# Patient Record
Sex: Female | Born: 1950 | Race: Asian | Hispanic: No | Smoking: Never smoker
Health system: Southern US, Community
[De-identification: ages and names within clinical notes are randomized; demographics above are authoritative.]

## PROBLEM LIST (undated history)

## (undated) DIAGNOSIS — R0989 Other specified symptoms and signs involving the circulatory and respiratory systems: Secondary | ICD-10-CM

## (undated) DIAGNOSIS — I10 Essential (primary) hypertension: Secondary | ICD-10-CM

## (undated) DIAGNOSIS — G4730 Sleep apnea, unspecified: Secondary | ICD-10-CM

## (undated) DIAGNOSIS — J449 Chronic obstructive pulmonary disease, unspecified: Secondary | ICD-10-CM

## (undated) HISTORY — PX: HERNIA REPAIR: SHX51

---

## 2013-11-08 ENCOUNTER — Ambulatory Visit: Payer: Self-pay

## 2013-11-08 ENCOUNTER — Ambulatory Visit: Payer: Self-pay | Admitting: Internal Medicine

## 2013-11-08 VITALS — HR 101 | Temp 99.7°F | Resp 17 | Ht <= 58 in | Wt 128.0 lb

## 2013-11-08 DIAGNOSIS — J449 Chronic obstructive pulmonary disease, unspecified: Secondary | ICD-10-CM

## 2013-11-08 DIAGNOSIS — R0602 Shortness of breath: Secondary | ICD-10-CM

## 2013-11-08 DIAGNOSIS — R05 Cough: Secondary | ICD-10-CM

## 2013-11-08 DIAGNOSIS — J9801 Acute bronchospasm: Secondary | ICD-10-CM

## 2013-11-08 DIAGNOSIS — J45909 Unspecified asthma, uncomplicated: Secondary | ICD-10-CM | POA: Insufficient documentation

## 2013-11-08 DIAGNOSIS — J441 Chronic obstructive pulmonary disease with (acute) exacerbation: Secondary | ICD-10-CM

## 2013-11-08 DIAGNOSIS — R0902 Hypoxemia: Secondary | ICD-10-CM

## 2013-11-08 DIAGNOSIS — R509 Fever, unspecified: Secondary | ICD-10-CM

## 2013-11-08 DIAGNOSIS — E785 Hyperlipidemia, unspecified: Secondary | ICD-10-CM | POA: Insufficient documentation

## 2013-11-08 DIAGNOSIS — I2510 Atherosclerotic heart disease of native coronary artery without angina pectoris: Secondary | ICD-10-CM | POA: Insufficient documentation

## 2013-11-08 DIAGNOSIS — I10 Essential (primary) hypertension: Secondary | ICD-10-CM | POA: Insufficient documentation

## 2013-11-08 LAB — POCT CBC
HCT, POC: 43.7 % (ref 37.7–47.9)
Lymph, poc: 2 (ref 0.6–3.4)
MCH, POC: 30.1 pg (ref 27–31.2)
MCHC: 31.6 g/dL — AB (ref 31.8–35.4)
MCV: 95.5 fL (ref 80–97)
MID (cbc): 1 — AB (ref 0–0.9)
POC Granulocyte: 9.2 — AB (ref 2–6.9)
POC LYMPH PERCENT: 16.6 %L (ref 10–50)
Platelet Count, POC: 226 10*3/uL (ref 142–424)
RDW, POC: 13.2 %
WBC: 12.2 10*3/uL — AB (ref 4.6–10.2)

## 2013-11-08 IMAGING — CR DG CHEST 2V
2 series · 2 of 2 positions shown · non-contrast
Comparison: None available

CLINICAL DATA: Cough, congestion

EXAM:
CHEST  2 VIEW

[PA]
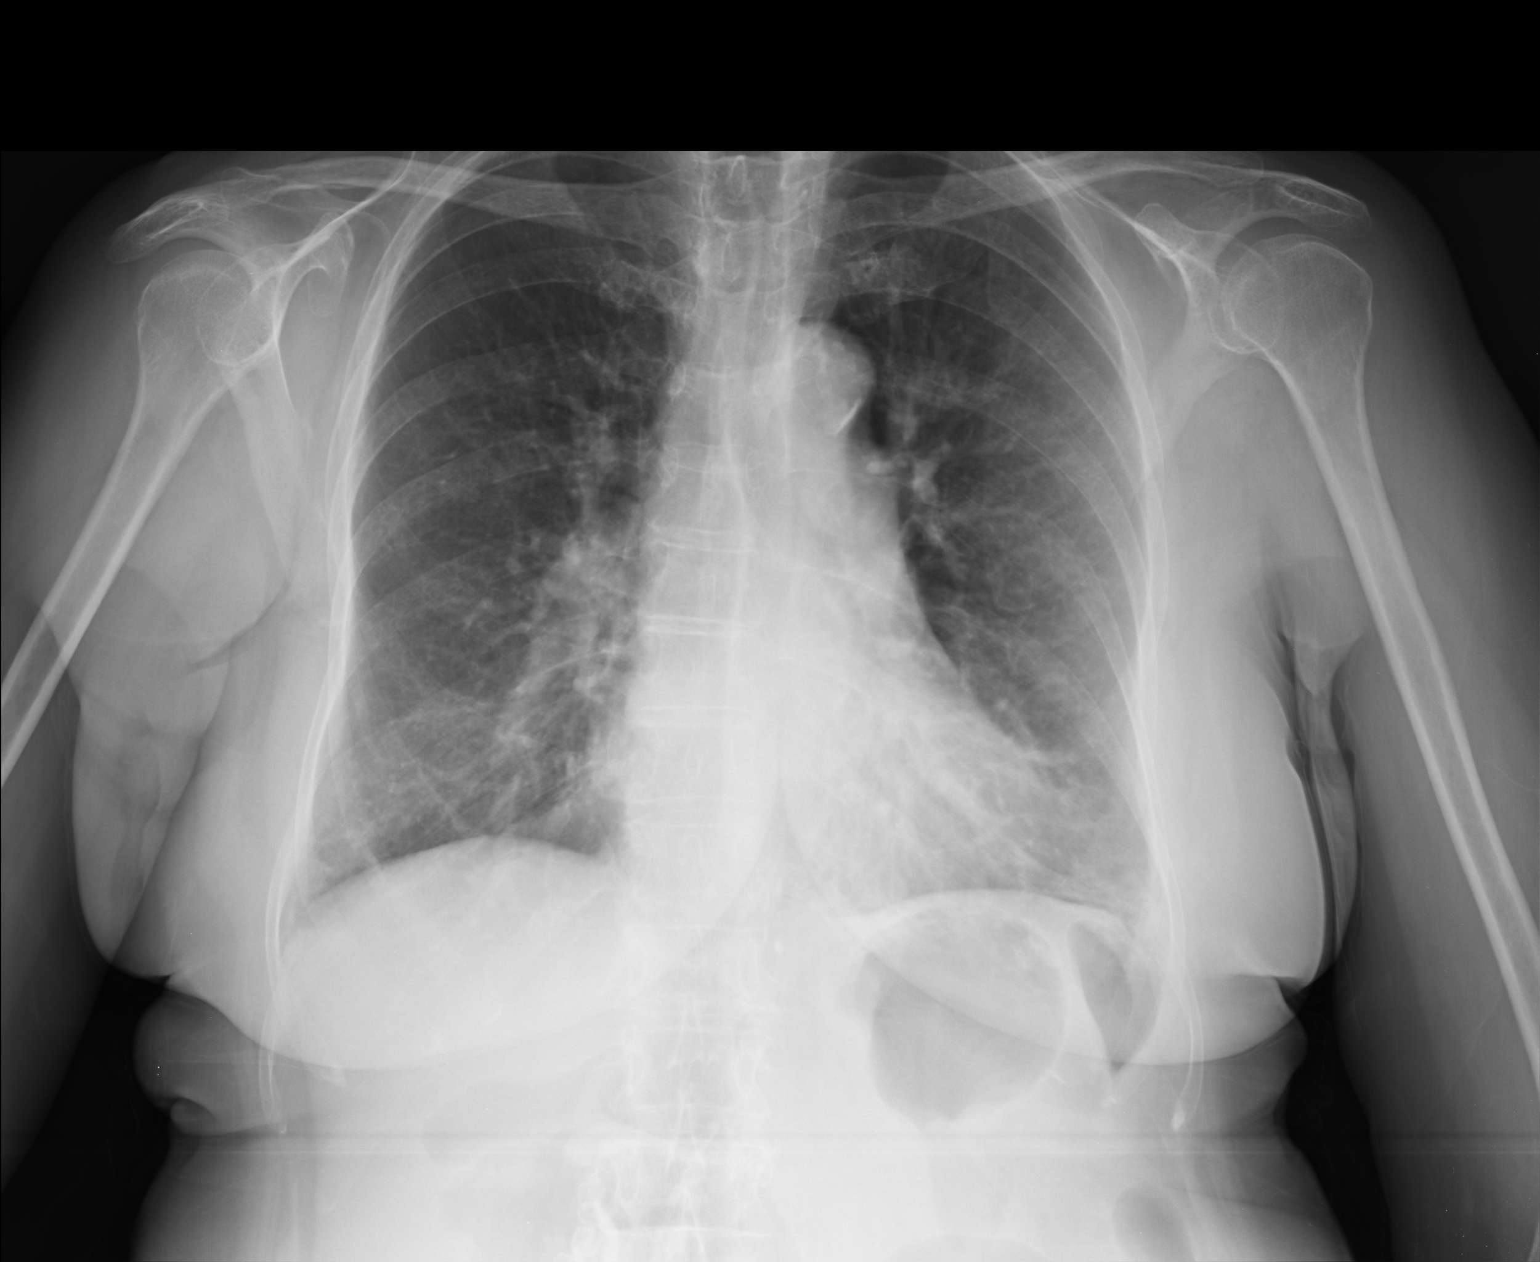

[lateral]
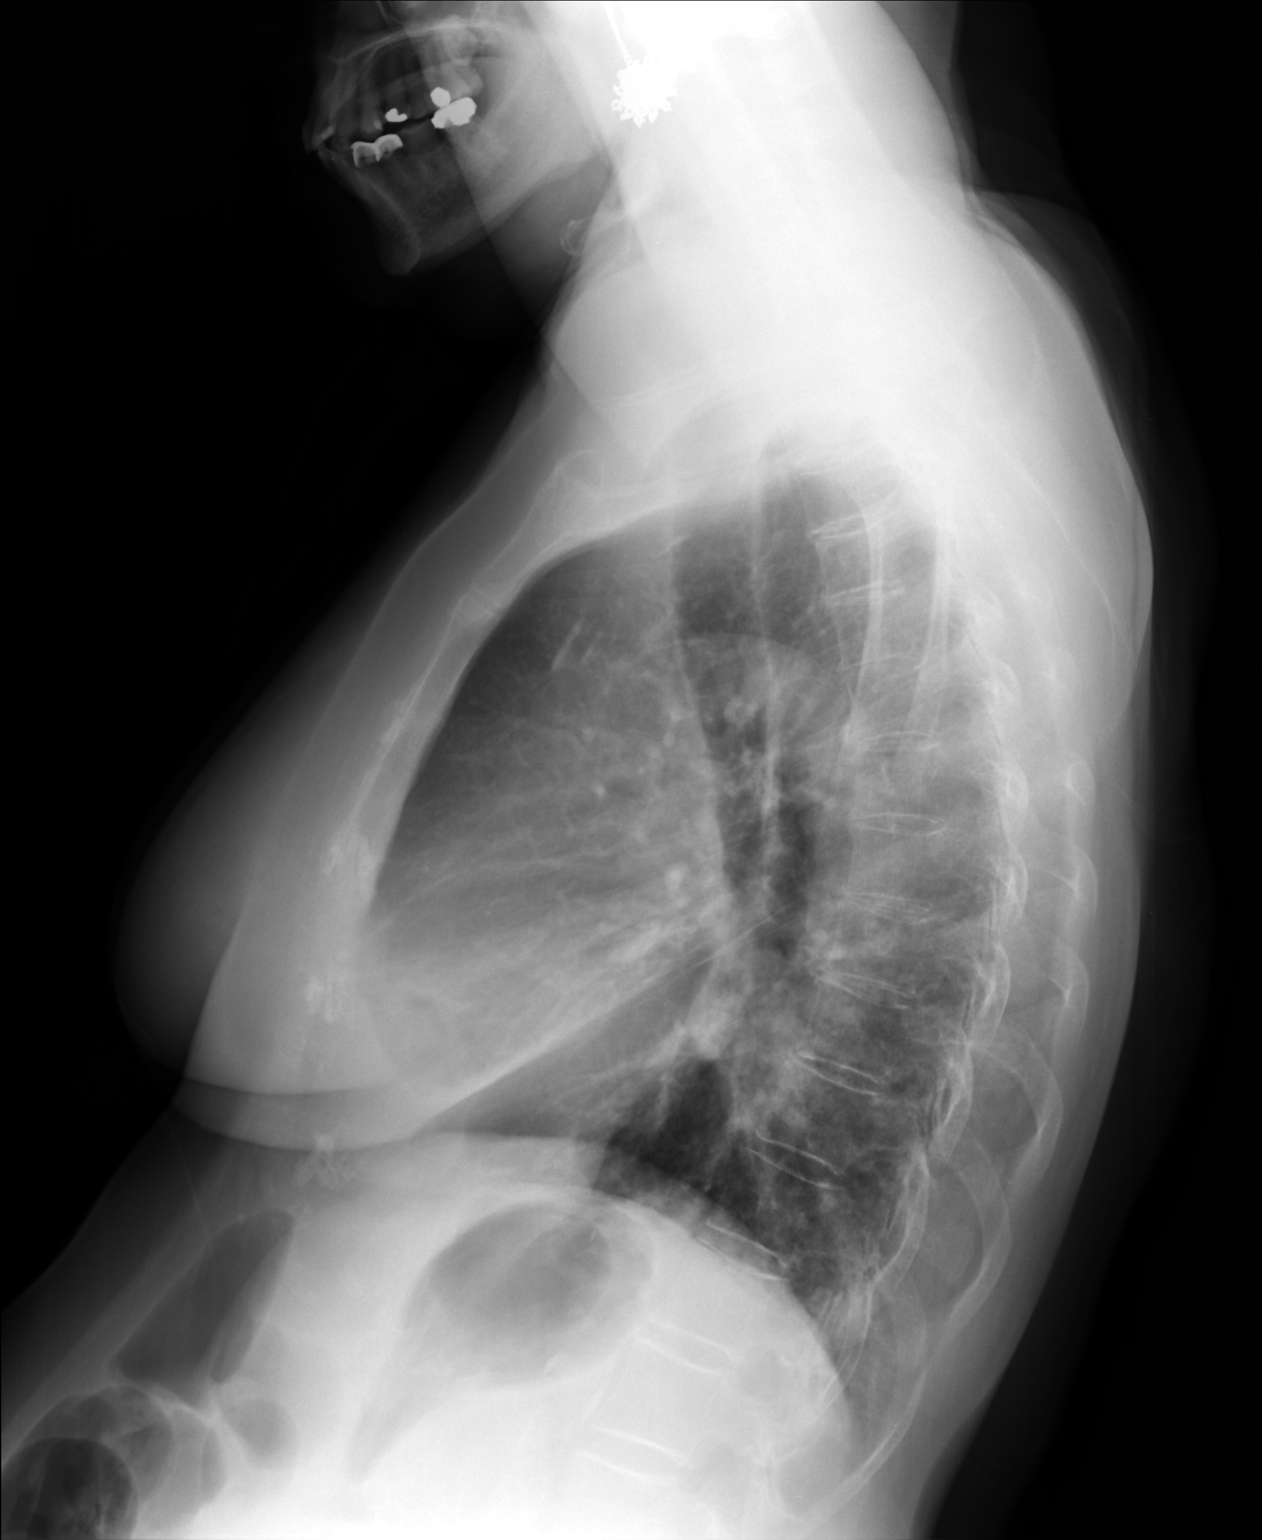

[2 of 2 positions shown; findings below may reference images not displayed]

FINDINGS: Normal cardiac silhouette. Minimal atherosclerotic plaque within the
thoracic aorta. There are minimal linear heterogeneous opacities
within the right middle lobe and lingula. There is mild diffuse
slightly nodular thickening of the pulmonary interstitium. No
pleural effusion pneumothorax. No evidence of edema. Unchanged
bones.
IMPRESSION: 1. Heterogeneous opacities within the right middle lobe and lingula
are favored to be the sequela of chronic CT infection and/or
atelectasis/scar, though in the absence of prior examinations, an
acute on chronic process is not excluded. Further evaluation could
be performed with chest CT as clinically indicated.
2. Mild bronchitic change.

## 2013-11-08 MED ORDER — LEVOFLOXACIN 500 MG PO TABS: 500.0000 mg | ORAL_TABLET | Freq: Every day | ORAL | Status: DC

## 2013-11-08 MED ORDER — HYDROCODONE-HOMATROPINE 5-1.5 MG/5ML PO SYRP: 5.0000 mL | ORAL_SOLUTION | Freq: Four times a day (QID) | ORAL | Status: DC | PRN

## 2013-11-08 MED ORDER — PREDNISONE 20 MG PO TABS: ORAL_TABLET | ORAL | Status: DC

## 2013-11-08 NOTE — Progress Notes (Signed)
Subjective:    Patient ID: Latoya Foster, female    DOB: 1951-08-02, 62 y.o.   MRN: [id number]  [id number] from [place] visiting her family on extended stay. Has underlying health problems: HTN- on amlod-5, dilti SR 90 bid, telma-H 40, lasix qod prn CAD-single vessel LAD less than critical, dx at unstable angina presentation in 2010-on plavix HL-atorva 40 Known pulm dz-COPD and Asthma on foracort,tiova(atrovent) and occas nebs-levolin  PO2 drops with walk into low 80s or even 70s  Hx of incr sob and cough with intermittent fever over past week despite nebs and adding amox given by her MD in [place] for travel/PND w/ need to sleep sitting up due to cough increase No ST,nasal cong No edema or chest pain  PMH-+pneumovax  No recent hospitalizations  ECHO 9/14 wnl  PO2 86 6/14  CT chest 10/13 atelectasis RML and lingula/bilat bronchiectasis lower lungfields  Review of Systems  Constitutional: Positive for fever, activity change, appetite change and fatigue. Negative for chills, diaphoresis and unexpected weight change.  Cardiovascular: Negative for chest pain, palpitations and leg swelling.  Gastrointestinal: Negative for nausea, vomiting and abdominal pain.       Was bloated recently but has resolved  Skin: Negative for rash.  Psychiatric/Behavioral:       No behav chges       Objective:   Physical Exam Pulse 101  Temp(Src) 99.7 F (37.6 C) (Oral)  Resp 17  Ht 4\' 10"  (1.473 m)  Wt 128 lb (58.06 kg)  BMI 26.76 kg/m2  SpO2 90%on 2L O2 Alert/oriented despite pulse ox 85-83 on arrival HEENT clear--No JVD Heart reg at 95 w/out m,r,g Lungs with wheezing bilat on both I&E with rhonchi mid lungfields to bases posteriorly on expiration abd soft No perip edema cn2-12 intact Symptoms improved at 2L O2    [facility] reading (PRIMARY) by  Dr. Merla Riches atelectasis RML and lingular present on film from [place] 2013,but appear a bit more prominent to me tonight.- Reading also suggested  bronchiectasis lower lobes bilat-but no obvious changes and diaphr borders clear   Results for orders placed in visit on 11/08/13  POCT CBC      Result Value Range   WBC 12.2 (*) 4.6 - 10.2 K/uL   Lymph, poc 2.0  0.6 - 3.4   POC LYMPH PERCENT 16.6  10 - 50 %L   MID (cbc) 1.0 (*) 0 - 0.9   POC MID % 8.0  0 - 12 %M   POC Granulocyte 9.2 (*) 2 - 6.9   Granulocyte percent 75.4  37 - 80 %G   RBC 4.58  4.04 - 5.48 M/uL   Hemoglobin 13.8  12.2 - 16.2 g/dL   HCT, POC 40.9  81.1 - 47.9 %   MCV 95.5  80 - 97 fL   MCH, POC 30.1  27 - 31.2 pg   MCHC 31.6 (*) 31.8 - 35.4 g/dL   RDW, POC 91.4     Platelet Count, POC 226  142 - 424 K/uL   MPV 7.8  0 - 99.8 fL   Duoneb improved subjective sxtoms--lung findings less prominent tho still marked expiratory changes PO2 93 after/then 91...  Assessment & Plan:  SOB  Acute bronchospasm - Plan: DG Chest 2 View, predniSONE (DELTASONE) 20 MG tablet  Hypoxia - Plan: DG Chest 2 View, Brain natriuretic peptide  Fever - Plan: POCT CBC, Comprehensive metabolic panel  COPD exacerbation - Plan: levofloxacin (LEVAQUIN) 500 MG tablet, predniSONE (DELTASONE) 20 MG tablet  HTN (hypertension)  Meds ordered this encounter  Medications  . levofloxacin (LEVAQUIN) 500 MG tablet    Sig: Take 1 tablet (500 mg total) by mouth daily.    Dispense:  7 tablet    Refill:  0  . predniSONE (DELTASONE) 20 MG tablet    Sig: 3/3/2/2/1/1 single daily dose for 6 days    Dispense:  12 tablet    Refill:  0  . HYDROcodone-homatropine (HYCODAN) 5-1.5 MG/5ML syrup    Sig: Take 5 mLs by mouth every 6 (six) hours as needed for cough.    Dispense:  120 mL    Refill:  0   CMET,BNP pending Consider home O2

## 2013-11-09 LAB — BRAIN NATRIURETIC PEPTIDE: Brain Natriuretic Peptide: 14.8 pg/mL (ref 0.0–100.0)

## 2013-11-09 LAB — COMPREHENSIVE METABOLIC PANEL WITH GFR
ALT: 14 U/L (ref 0–35)
AST: 18 U/L (ref 0–37)
Albumin: 3.9 g/dL (ref 3.5–5.2)
Alkaline Phosphatase: 82 U/L (ref 39–117)
BUN: 6 mg/dL (ref 6–23)
CO2: 31 meq/L (ref 19–32)
Calcium: 8.9 mg/dL (ref 8.4–10.5)
Chloride: 102 meq/L (ref 96–112)
Creat: 0.44 mg/dL — ABNORMAL LOW (ref 0.50–1.10)
Glucose, Bld: 144 mg/dL — ABNORMAL HIGH (ref 70–99)
Potassium: 4 meq/L (ref 3.5–5.3)
Sodium: 140 meq/L (ref 135–145)
Total Bilirubin: 0.8 mg/dL (ref 0.3–1.2)
Total Protein: 6.8 g/dL (ref 6.0–8.3)

## 2021-06-10 ENCOUNTER — Ambulatory Visit (INDEPENDENT_AMBULATORY_CARE_PROVIDER_SITE_OTHER): Payer: 59 | Admitting: Pulmonary Disease

## 2021-06-10 ENCOUNTER — Encounter: Payer: Self-pay | Admitting: Pulmonary Disease

## 2021-06-10 ENCOUNTER — Other Ambulatory Visit: Payer: Self-pay

## 2021-06-10 VITALS — BP 118/74 | HR 69 | Temp 97.4°F | Ht <= 58 in | Wt 117.7 lb

## 2021-06-10 DIAGNOSIS — J479 Bronchiectasis, uncomplicated: Secondary | ICD-10-CM

## 2021-06-10 DIAGNOSIS — G4733 Obstructive sleep apnea (adult) (pediatric): Secondary | ICD-10-CM | POA: Diagnosis not present

## 2021-06-10 DIAGNOSIS — J45909 Unspecified asthma, uncomplicated: Secondary | ICD-10-CM | POA: Diagnosis not present

## 2021-06-10 DIAGNOSIS — I2729 Other secondary pulmonary hypertension: Secondary | ICD-10-CM

## 2021-06-10 DIAGNOSIS — J449 Chronic obstructive pulmonary disease, unspecified: Secondary | ICD-10-CM | POA: Diagnosis not present

## 2021-06-10 NOTE — Assessment & Plan Note (Addendum)
She seemed to have severe OSA that was corrected by CPAP with desaturations that was out of proportion.  Epworth score is 11 & she has daytime sleepines  We will reassess with a home sleep test.  Her current compliance is not great and this assessment will allow [place] to reinforce compliance

## 2021-06-10 NOTE — Assessment & Plan Note (Signed)
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  If she has significant sputum production in the future we will consider obtaining AFB for MAC but currently this does not seem to be an issue.  Based on previous imaging appears to be nonprogressive. We discussed signs and symptoms of an exacerbation requiring oral antibiotics and airway clearance

## 2021-06-10 NOTE — Patient Instructions (Addendum)
  Continue your current medications -when you are close to running out, call me for refills and we can substitute with Advair/Spiriva or Trelegy Ambulatory saturation. Schedule PFTs. Schedule home sleep test -Based on this we will decide need for CPAP and/ or  oxygen during sleep We discussed signs and symptoms of a flareup and please call me as needed

## 2021-06-10 NOTE — Progress Notes (Addendum)
Subjective:    Patient ID: Latoya Foster, female    DOB: [birth year], 70 y.o.   MRN: [id number]  HPI  Chief Complaint  Patient presents with   Consult    Just moved from [place], needs to get established with pulmonary for COPD, Bronchiectasis, doing ok at the moment    70 year old never smoker presents to establish care for bronchiectasis, "COPD/asthma" chronic hypoxic respiratory failure Her daughter Deniece Ree is a pediatrician at [facility].  She has just emigrated from [place] and plans to reside here and is applying for green card. I have reviewed her extensive records from [place] before moving to [place] in the 70s and again back to [place] 2014.  There is no history of exposure to feels.  Lung problem started in 2014 when she was diagnosed with asthma and bronchiectasis She was hospitalized in 2018 and again in 2019 initially placed on nocturnal oxygen, then on CPAP for OSA. In 2020 she was hospitalized and found to be hypoxic to 80% on room air and started on daytime oxygen I note an echo from 2019 showing severe pulmonary hypertension but follow-up echo in 2022 seems to show decreased RVSP to 38 She moved to [place] in March 2020 and even though she brought her oxygen with her heart running more than 92% on room air and she has rarely needed oxygen  She was diagnosed with OSA & provided CPAP. ESS is 11 & she reports daytime naps & sleepiness   She is maintained on a regimen =  Symbicort, tiotropium and Singulair  PMH -hypertension, hyperlipidemia, angina, unclear why she is on Plavix  Past surgical history septoplasty 92, umbilical hernia repair 2008  Significant tests/ events reviewed  NPSG 2016-AHI 30/hour with desaturation 62% out of proportion to degree of OSA, corrected by CPAP 12 cm  CT angiogram chest 02/2018 bronchiectasis of right middle lobe and lingula, dilated pulmonary artery 31 mm  Echo 2022 RVSP 38 Echo 02/2018 RVSP 67 with severe TR   No  known drug allergies Social lifetime never smoker, emigrated to the [place] 01/2021, lives with her daughter in [place] Chest x-ray 10/2013 reviewed within our system, shows opacities within right middle lobe and lingula likely due to bronchiectasis  Family history of asthma and heart disease on her mother side  No past medical history on file.   Review of Systems Constitutional: negative for anorexia, fevers and sweats  Eyes: negative for irritation, redness and visual disturbance  Ears, nose, mouth, throat, and face: negative for earaches, epistaxis, nasal congestion and sore throat  Respiratory: negative for cough, sputum and wheezing  Cardiovascular: negative for chest pain, lower extremity edema, orthopnea, palpitations and syncope  Gastrointestinal: negative for abdominal pain, constipation, diarrhea, melena, nausea and vomiting  Genitourinary:negative for dysuria, frequency and hematuria  Hematologic/lymphatic: negative for bleeding, easy bruising and lymphadenopathy  Musculoskeletal:negative for arthralgias, muscle weakness and stiff joints  Neurological: negative for coordination problems, gait problems, headaches and weakness  Endocrine: negative for diabetic symptoms including polydipsia, polyuria and weight loss     Objective:   Physical Exam  Gen. Pleasant, well-nourished, in no distress, normal affect ENT - no pallor,icterus, no post nasal drip Neck: No JVD, no thyromegaly, no carotid bruits Lungs: no use of accessory muscles, no dullness to percussion, dry crackles both axilla Cardiovascular: Rhythm regular, heart sounds  normal, no murmurs or gallops, no peripheral edema Abdomen: soft and non-tender, no hepatosplenomegaly, BS normal. Musculoskeletal: No deformities, no cyanosis or clubbing  Neuro:  alert, non focal       Assessment & Plan:

## 2021-06-10 NOTE — Assessment & Plan Note (Signed)
Continue her current regimen Equell him to Symbicort and Spiriva.  When she runs out of her medications that she has brought with her from [place], we can prescribe above or consider single triple therapy regimen such as Trelegy or [employer]

## 2021-06-10 NOTE — Assessment & Plan Note (Signed)
Appears to be secondary to cardiopulmonary cause.  On the more recent echo in 20 Rendu RVSP seems to have decreased to 38.  This might explain improvement in her hypoxia.

## 2021-06-20 ENCOUNTER — Other Ambulatory Visit: Payer: Self-pay | Admitting: Internal Medicine

## 2021-06-20 DIAGNOSIS — E2839 Other primary ovarian failure: Secondary | ICD-10-CM

## 2021-06-20 DIAGNOSIS — R5381 Other malaise: Secondary | ICD-10-CM

## 2021-06-25 ENCOUNTER — Other Ambulatory Visit: Payer: Self-pay

## 2021-06-25 ENCOUNTER — Ambulatory Visit
Admission: RE | Admit: 2021-06-25 | Discharge: 2021-06-25 | Disposition: A | Discharge status: Home / Self Care | Payer: 59 | Source: Ambulatory Visit | Attending: Internal Medicine | Admitting: Internal Medicine

## 2021-06-25 DIAGNOSIS — E2839 Other primary ovarian failure: Secondary | ICD-10-CM

## 2021-07-22 ENCOUNTER — Other Ambulatory Visit: Payer: Self-pay

## 2021-07-22 ENCOUNTER — Ambulatory Visit: Payer: 59

## 2021-07-22 DIAGNOSIS — J449 Chronic obstructive pulmonary disease, unspecified: Secondary | ICD-10-CM

## 2021-07-22 DIAGNOSIS — G4733 Obstructive sleep apnea (adult) (pediatric): Secondary | ICD-10-CM

## 2021-07-24 ENCOUNTER — Telehealth: Payer: Self-pay | Admitting: Pulmonary Disease

## 2021-07-24 DIAGNOSIS — G4733 Obstructive sleep apnea (adult) (pediatric): Secondary | ICD-10-CM

## 2021-07-24 DIAGNOSIS — J479 Bronchiectasis, uncomplicated: Secondary | ICD-10-CM

## 2021-07-24 NOTE — Telephone Encounter (Signed)
Home sleep test showed moderate OSA AHI 24/hour Oxygen dropped a good bit during her sleep lowest saturation was 62%  Ideally she needs a CPAP titration study If she wants to hold off for cost purposes that would be okay but would suggest that she get back on her CPAP machine and have oxygen blended into the machine

## 2021-07-24 NOTE — Telephone Encounter (Signed)
Tried calling the pt and there was no answer and no option to leave msg.

## 2021-08-07 NOTE — Telephone Encounter (Signed)
I spoke with the pt's daughter ok per DPR and notified of results  She verbalized understanding  Order was placed for CPAP titration study  Daughter was asking if we should wait until after test to start her o2, she has an old concentrator and it's not working well

## 2021-08-08 NOTE — Telephone Encounter (Signed)
Latoya Milch, MD to Me     8:37 PM She should restart CPAP and oxygen while awaiting titration study and then I can clarify whether she needs both her only CPAP would suffice   Please advise settings for CPAP and for o2, thanks

## 2021-08-11 NOTE — Telephone Encounter (Signed)
Caller: Unspecified (2 weeks ago) She has both CPAP & O2, from [place]  So resume at same settings - I do not have those settings on record   Spoke with the pt's daughter and notified her of response per Dr Vassie Loll. She will have her resume what she was on before until we get the CPAP titration done. Nothing further needed.

## 2021-08-12 ENCOUNTER — Other Ambulatory Visit: Payer: Self-pay | Admitting: Internal Medicine

## 2021-08-12 DIAGNOSIS — Z1231 Encounter for screening mammogram for malignant neoplasm of breast: Secondary | ICD-10-CM

## 2021-08-26 ENCOUNTER — Encounter: Payer: Self-pay | Admitting: *Deleted

## 2021-08-26 ENCOUNTER — Telehealth: Payer: Self-pay | Admitting: Pulmonary Disease

## 2021-08-26 DIAGNOSIS — G4734 Idiopathic sleep related nonobstructive alveolar hypoventilation: Secondary | ICD-10-CM

## 2021-08-26 NOTE — Telephone Encounter (Signed)
Latoya Milch, MD to Me     12:37 PM  Ok to send order   I placed order and sent a msg to pt via mychart regarding this per daughter's request.

## 2021-08-26 NOTE — Telephone Encounter (Signed)
Spoke with the pt's daughter  She states that pt's concentrator from [place] is not working properly anymore  It's making a beeping noise all the time She uses 2lpm at night with her CPAP  Not est with DME here  Please advise if okay to send order

## 2021-09-16 ENCOUNTER — Encounter: Payer: Self-pay | Admitting: Pulmonary Disease

## 2021-09-16 ENCOUNTER — Other Ambulatory Visit: Payer: Self-pay

## 2021-09-16 ENCOUNTER — Ambulatory Visit (INDEPENDENT_AMBULATORY_CARE_PROVIDER_SITE_OTHER): Payer: 59 | Admitting: Pulmonary Disease

## 2021-09-16 DIAGNOSIS — G4733 Obstructive sleep apnea (adult) (pediatric): Secondary | ICD-10-CM

## 2021-09-16 DIAGNOSIS — J449 Chronic obstructive pulmonary disease, unspecified: Secondary | ICD-10-CM

## 2021-09-16 DIAGNOSIS — J479 Bronchiectasis, uncomplicated: Secondary | ICD-10-CM | POA: Diagnosis not present

## 2021-09-16 LAB — PULMONARY FUNCTION TEST
DL/VA % pred: 93 %
DL/VA: 3.73 ml/min/mmHg/L
DLCO cor % pred: 46 %
DLCO cor: 7.84 ml/min/mmHg
DLCO unc % pred: 46 %
DLCO unc: 7.84 ml/min/mmHg
FEF 25-75 Post: 0.28 L/sec
FEF 25-75 Pre: 0.33 L/sec
FEF2575-%Change-Post: -13 %
FEF2575-%Pred-Post: 18 %
FEF2575-%Pred-Pre: 21 %
FEV1-%Change-Post: -5 %
FEV1-%Pred-Post: 32 %
FEV1-%Pred-Pre: 34 %
FEV1-Post: 0.54 L
FEV1-Pre: 0.57 L
FEV1FVC-%Change-Post: 5 %
FEV1FVC-%Pred-Pre: 85 %
FEV6-%Change-Post: -10 %
FEV6-Post: 0.75 L
FEV6-Pre: 0.84 L
FVC-%Change-Post: -10 %
FVC-%Pred-Post: 36 %
FVC-%Pred-Pre: 40 %
FVC-Post: 0.75 L
FVC-Pre: 0.84 L
Post FEV1/FVC ratio: 72 %
Post FEV6/FVC ratio: 100 %
Pre FEV1/FVC ratio: 68 %
Pre FEV6/FVC Ratio: 100 %
RV % pred: 109 %
RV: 2.14 L
TLC % pred: 74 %
TLC: 3.15 L

## 2021-09-16 NOTE — Assessment & Plan Note (Signed)
We discussed signs and symptoms of exacerbation for which she will call us as needed

## 2021-09-16 NOTE — Progress Notes (Signed)
   Subjective:    Patient ID: Latoya Foster, female    DOB: [birth year], 70 y.o.   MRN: [id number]  HPI  70 year old never smoker for FU of bronchiectasis, "COPD/asthma" chronic hypoxic respiratory failure , OSA on CPAP Her daughter Latoya Foster is a pediatrician at [facility] health  PMH -hypertension, hyperlipidemia, angina, unclear why she is on Plavix  In 2020 she was hospitalized in [place] and found to be hypoxic to 80% on room air and started on daytime oxygen   After her last visit, we repeated home sleep test which showed moderate OSA and severe desaturation to 62%.  I asked her to continue on CPAP with oxygen blended in.  She is obtain an oxygen concentrator to use during sleep. She is planning to travel to [place] by December to be with her son for his 50th birthday and plans to stay there for 3 months.  Daughter questions whether she needs oxygen during travel. On ambulation she desaturated to 92% after 2 laps.  And recovered quickly. No interim cough or wheezing. She is compliant with medications that she had prescribed from [place] which is the equivalent of Symbicort and Spiriva  Significant tests/ events reviewed  PFTs 08/2021 >> severe airway obstruction, ratio 68, FEV1 34%, FVC 40%, no bronchodilator response, TLC 74%, DLCO 7.8/26%   HST 06/2021 - moderate OSA AHI 24/hour Nadir lowest saturation was 62%  NPSG 2016-AHI 30/hour with desaturation 62% out of proportion to degree of OSA, corrected by CPAP 12 cm   CT angiogram chest 02/2018 bronchiectasis of right middle lobe and lingula, dilated pulmonary artery 31 mm   Echo 2022 RVSP 38 Echo 02/2018 RVSP 67 with severe TR    Review of Systems neg for any significant sore throat, dysphagia, itching, sneezing, nasal congestion or excess/ purulent secretions, fever, chills, sweats, unintended wt loss, pleuritic or exertional cp, hempoptysis, orthopnea pnd or change in chronic leg swelling. Also denies presyncope, palpitations, heartburn,  abdominal pain, nausea, vomiting, diarrhea or change in bowel or urinary habits, dysuria,hematuria, rash, arthralgias, visual complaints, headache, numbness weakness or ataxia.     Objective:   Physical Exam  Gen. Pleasant, elderly, thin  in no distress ENT - no thrush, no pallor/icterus,no post nasal drip Neck: No JVD, no thyromegaly, no carotid bruits Lungs: no use of accessory muscles, no dullness to percussion, right basal rales no rhonchi  Cardiovascular: Rhythm regular, heart sounds  normal, no murmurs or gallops, no peripheral edema Musculoskeletal: No deformities, no cyanosis or clubbing        Assessment & Plan:

## 2021-09-16 NOTE — Patient Instructions (Signed)
Ambulatory sat for POC Lung testing shows severe airway obstruction - lung capacity at 35%

## 2021-09-16 NOTE — Progress Notes (Signed)
Full PFT performed today. °

## 2021-09-16 NOTE — Assessment & Plan Note (Signed)
She has a formal CPAP titration study pending and based on this we will make changes to her CPAP and also decide whether she needs oxygen during sleep. In the meantime due to severe desaturation I will asked her to continue using oxygen blended into CPAP

## 2021-09-16 NOTE — Assessment & Plan Note (Addendum)
PFTs confirmed moderate to severe airway obstruction and perhaps some element of restriction with decreased DLCO and TLC.  She will continue medications which are the equivalent of Symbicort and Spiriva. We can switch her to triple therapy in the future.  She has enough medications to last her for the next 6 months Based on ambulatory saturations today she does not seem to qualify for oxygen. We discussed pulmonary rehab program

## 2021-09-16 NOTE — Patient Instructions (Signed)
Full PFT performed today. °

## 2021-09-29 ENCOUNTER — Ambulatory Visit (HOSPITAL_BASED_OUTPATIENT_CLINIC_OR_DEPARTMENT_OTHER): Payer: 59 | Attending: Pulmonary Disease | Admitting: Pulmonary Disease

## 2021-09-29 ENCOUNTER — Other Ambulatory Visit: Payer: Self-pay

## 2021-09-29 DIAGNOSIS — G4733 Obstructive sleep apnea (adult) (pediatric): Secondary | ICD-10-CM | POA: Insufficient documentation

## 2021-09-30 DIAGNOSIS — G4734 Idiopathic sleep related nonobstructive alveolar hypoventilation: Secondary | ICD-10-CM

## 2021-09-30 DIAGNOSIS — G4733 Obstructive sleep apnea (adult) (pediatric): Secondary | ICD-10-CM

## 2021-09-30 NOTE — Telephone Encounter (Signed)
Received the following message from patient:   "Hello Dr Vassie Loll,  At the last visit I had mentioned that my mouth was very dry after the start of night oxygen use. The company needs an order for the humidifier attachment. It is [employer]. Could you please send an order for the humidifier for the oxygen concentrator. I also had the in-hospital sleep study last night & will wait on the results & keep all the settings the same till then. Thank you and have a wonderful day. Latoya Foster"  Dr. Vassie Loll, please advise if you are ok with [place] placing an order for the humidifier. Thanks!

## 2021-10-01 NOTE — Telephone Encounter (Signed)
RA - please advise. Thanks! 

## 2021-10-02 ENCOUNTER — Telehealth: Payer: Self-pay | Admitting: Pulmonary Disease

## 2021-10-02 DIAGNOSIS — G4733 Obstructive sleep apnea (adult) (pediatric): Secondary | ICD-10-CM

## 2021-10-02 NOTE — Telephone Encounter (Signed)
CPAP titration study showed pressure requirements up to 17 cm. She already has a CPAP machine. Please have DME adjust settings to auto 5 to 18 cm and hopefully we can check a download on her next visit. No oxygen was required during the study

## 2021-10-02 NOTE — Procedures (Signed)
Patient Name: Latoya Foster, Latoya Foster Date: 09/29/2021 Gender: Female D.O.B: 08-Dec-1950 Age (years): 47 Referring Provider: Cyril Mourning MD, ABSM Height (inches): 59 Interpreting Physician: Cyril Mourning MD, ABSM Weight (lbs): 114 RPSGT: Rosette Reveal BMI: 23 MRN: [id number] Neck Size: 14.00 <br> <br> CLINICAL INFORMATION The patient is referred for a CPAP titration to treat sleep apnea.    HST 06/2021 - moderate OSA AHI 24/hour, Nadir lowest saturation was 62% SLEEP STUDY TECHNIQUE As per the AASM Manual for the Scoring of Sleep and Associated Events v2.3 (April 2016) with a hypopnea requiring 4% desaturations.  The channels recorded and monitored were frontal, central and occipital EEG, electrooculogram (EOG), submentalis EMG (chin), nasal and oral airflow, thoracic and abdominal wall motion, anterior tibialis EMG, snore microphone, electrocardiogram, and pulse oximetry. Continuous positive airway pressure (CPAP) was initiated at the beginning of the study and titrated to treat sleep-disordered breathing.  MEDICATIONS Medications self-administered by patient taken the night of the study : N/A  RESPIRATORY PARAMETERS Optimal PAP Pressure (cm): 17 AHI at Optimal Pressure (/hr): 11.3 Overall Minimal O2 (%): 82.0 Supine % at Optimal Pressure (%): 0 Minimal O2 at Optimal Pressure (%): 84.0   SLEEP ARCHITECTURE The study was initiated at 10:54:58 PM and ended at 5:13:23 AM.  Sleep onset time was 18.3 minutes and the sleep efficiency was 72.5%%. The total sleep time was 274.5 minutes.  The patient spent 8.9%% of the night in stage N1 sleep, 66.5%% in stage N2 sleep, 0.0%% in stage N3 and 24.6% in REM.Stage REM latency was 167.5 minutes  Wake after sleep onset was 85.6. Alpha intrusion was absent. Supine sleep was 31.69%.  CARDIAC DATA The 2 lead EKG demonstrated sinus rhythm. The mean heart rate was 49.0 beats per minute. Other EKG findings include: None.  LEG MOVEMENT DATA The total  Periodic Limb Movements of Sleep (PLMS) were 0. The PLMS index was 0.0. A PLMS index of <15 is considered normal in adults.  IMPRESSIONS - The final PAP pressure was 17 cm of water. Predominantly REM related events were noted - Moderate oxygen desaturations were observed during this titration (min O2 = 82.0%). - The patient snored with soft snoring volume during this titration study. - No cardiac abnormalities were observed during this study. - Clinically significant periodic limb movements were not noted during this study. Arousals associated with PLMs were rare.  DIAGNOSIS - Obstructive Sleep Apnea (G47.33)  RECOMMENDATIONS - Trial of CPAP therapy on 17 cm H2O with a Small size Fisher&Paykel Full Face Mask F&P Vitera (new) mask and heated humidification. Alternatively, autoCPAP can be used. - Avoid alcohol, sedatives and other CNS depressants that may worsen sleep apnea and disrupt normal sleep architecture. - Sleep hygiene should be reviewed to assess factors that may improve sleep quality. - Weight management and regular exercise should be initiated or continued. - Return to [facility] Center for re-evaluation after 4 weeks of therapy   Cyril Mourning MD Board Certified in Sleep medicine

## 2021-10-08 DIAGNOSIS — G4733 Obstructive sleep apnea (adult) (pediatric): Secondary | ICD-10-CM

## 2021-10-08 NOTE — Telephone Encounter (Signed)
Please advise if patient needs to continue oxygen at night.   RA

## 2021-10-08 NOTE — Telephone Encounter (Signed)
Responded to pt's email with this info and the dme order was sent to change settings

## 2022-05-08 ENCOUNTER — Encounter: Payer: Self-pay | Admitting: Pulmonary Disease

## 2022-05-08 ENCOUNTER — Ambulatory Visit (INDEPENDENT_AMBULATORY_CARE_PROVIDER_SITE_OTHER): Payer: Managed Care, Other (non HMO) | Admitting: Pulmonary Disease

## 2022-05-08 VITALS — BP 115/52 | HR 84 | Temp 97.7°F | Ht <= 58 in | Wt 109.4 lb

## 2022-05-08 DIAGNOSIS — G4733 Obstructive sleep apnea (adult) (pediatric): Secondary | ICD-10-CM

## 2022-05-08 DIAGNOSIS — J479 Bronchiectasis, uncomplicated: Secondary | ICD-10-CM | POA: Diagnosis not present

## 2022-05-08 DIAGNOSIS — Z9989 Dependence on other enabling machines and devices: Secondary | ICD-10-CM

## 2022-05-08 DIAGNOSIS — J449 Chronic obstructive pulmonary disease, unspecified: Secondary | ICD-10-CM | POA: Diagnosis not present

## 2022-05-08 MED ORDER — TIOTROPIUM BROMIDE MONOHYDRATE 18 MCG IN CAPS: 18.0000 ug | ORAL_CAPSULE | Freq: Every day | RESPIRATORY_TRACT | 3 refills | Status: DC

## 2022-05-08 MED ORDER — BUDESONIDE-FORMOTEROL FUMARATE 160-4.5 MCG/ACT IN AERO: 2.0000 | INHALATION_SPRAY | Freq: Two times a day (BID) | RESPIRATORY_TRACT | 6 refills | Status: DC

## 2022-05-08 NOTE — Assessment & Plan Note (Signed)
Continue nocturnal CPAP, auto settings. We discussed alternative therapies including hypoglossal nerve stimulator implant and dental appliance. We will recheck nocturnal oximetry on CPAP/room air to see if she still needs nocturnal oxygen

## 2022-05-08 NOTE — Assessment & Plan Note (Signed)
Continue Symbicort and Spiriva Never smoker , this is likely on the basis of bronchiectasis or "fixed" airway obstruction

## 2022-05-08 NOTE — Progress Notes (Signed)
   Subjective:    Patient ID: Latoya Foster, female    DOB: Apr 11, 1951, 71 y.o.   MRN: [id number]  HPI  71 yo never smoker for FU of bronchiectasis, "COPD/asthma" chronic hypoxic respiratory failure , OSA on CPAP Her daughter Deniece Ree is a pediatrician at [facility] health   PMH -hypertension, hyperlipidemia, angina, on Plavix   In 2020 she was hospitalized in [place] and found to be hypoxic to 80% on room air and started on daytime oxygen  She has since weaned off daytime oxygen but uses it at night  Chief Complaint  Patient presents with   Follow-up    No complaints. No coughing, wheezing, SOB.  CPAP with no O2   She was in [place] for 5 months with her son and came back to [place] few weeks ago.  Uneventful duration for her,  No flareup  She reports compliance with CPAP although she has some trouble tolerating this.  She denies cough or wheezing.  She gets short of breath climbing stairs. Daughter accompanies and corroborates history. She wonders if she still needs oxygen She is compliant with Symbicort and Spiriva  Significant tests/ events reviewed  PFTs 08/2021 >> severe airway obstruction, ratio 68, FEV1 34%, FVC 40%, no bronchodilator response, TLC 74%, DLCO 7.8/26%   09/2021 CPAP titration study showed pressure requirements up to 17 cm, no O2 required   HST 06/2021 - moderate OSA AHI 24/hour Nadir lowest saturation was 62%   NPSG 2016-AHI 30/hour with desaturation 62% out of proportion to degree of OSA, corrected by CPAP 12 cm   CT angiogram chest 02/2018 bronchiectasis of right middle lobe and lingula, dilated pulmonary artery 31 mm   Echo 2022 RVSP 38 Echo 02/2018 RVSP 67 with severe TR   Review of Systems neg for any significant sore throat, dysphagia, itching, sneezing, nasal congestion or excess/ purulent secretions, fever, chills, sweats, unintended wt loss, pleuritic or exertional cp, hempoptysis, orthopnea pnd or change in chronic leg swelling. Also denies  presyncope, palpitations, heartburn, abdominal pain, nausea, vomiting, diarrhea or change in bowel or urinary habits, dysuria,hematuria, rash, arthralgias, visual complaints, headache, numbness weakness or ataxia.     Objective:   Physical Exam   Gen. Pleasant, elderly thin woman, in no distress ENT - no thrush, no pallor/icterus,no post nasal drip Neck: No JVD, no thyromegaly, no carotid bruits Lungs: no use of accessory muscles, no dullness to percussion, right basal dry rales no rhonchi  Cardiovascular: Rhythm regular, heart sounds  normal, no murmurs or gallops, no peripheral edema Musculoskeletal: No deformities, no cyanosis or clubbing         Assessment & Plan:

## 2022-05-08 NOTE — Patient Instructions (Signed)
  X Refills on symbicort & spiriva  X Check ONO on CPAP/ RA

## 2022-05-08 NOTE — Assessment & Plan Note (Addendum)
We discussed signs and symptoms of flareup for which she would need antibiotics.  She has done well over the past year We will repeat CT chest at some point in the future

## 2022-05-12 ENCOUNTER — Other Ambulatory Visit (HOSPITAL_COMMUNITY): Payer: Self-pay

## 2022-05-12 ENCOUNTER — Telehealth: Payer: Self-pay

## 2022-05-12 NOTE — Telephone Encounter (Signed)
Patient Advocate Encounter   Received notification that prior authorization for Spiriva HandiHaler capsules is required.   PA submitted on 05/12/2022 Key [id number] Status is pending

## 2022-05-12 NOTE — Telephone Encounter (Signed)
Patient Advocate Encounter   Received notification that prior authorization for Budesonide-Formoterol Fumarate 160-4.5MCG/ACT aerosol is required.   PA submitted on 05/12/2022 Key [id number] Status is pending

## 2022-05-20 ENCOUNTER — Other Ambulatory Visit (HOSPITAL_COMMUNITY): Payer: Self-pay

## 2022-05-20 NOTE — Telephone Encounter (Signed)
Dr. Vassie Loll please advise on message from pharmacy team.  Duwayne Heck are you able to do a test claim and see what is covered for patient?

## 2022-05-20 NOTE — Telephone Encounter (Signed)
ATC patient/daughter about inhaler change, LMTCB

## 2022-05-20 NOTE — Telephone Encounter (Signed)
[facility] Patient Advocate Encounter  Received notification that the request for prior authorization for Spiriva HandiHaler capsules  has been denied due to patient not having tried and failed Incruse Ellipta or Fernande Boyden (may require a prior auth).

## 2022-05-21 ENCOUNTER — Other Ambulatory Visit (HOSPITAL_COMMUNITY): Payer: Self-pay

## 2022-05-21 MED ORDER — INCRUSE ELLIPTA 62.5 MCG/ACT IN AEPB: 1.0000 | INHALATION_SPRAY | Freq: Every day | RESPIRATORY_TRACT | 5 refills | Status: DC

## 2022-05-21 NOTE — Addendum Note (Signed)
Addended by: Maurene Capes on: 05/21/2022 11:26 AM   Modules accepted: Orders

## 2022-05-21 NOTE — Telephone Encounter (Signed)
[facility] Patient Advocate Encounter  Received notification that the request for prior authorization for Budesonide-Formoterol Fumarate 160-4.5MCG/ACT aerosol  has been denied due to patient not having tried and failed any other step therapy. Insurance will cover Wixela inhaler at [employer]. Incruse was sent into patient's pharmacy today for a $33.25 copay.

## 2022-05-21 NOTE — Telephone Encounter (Signed)
Called and spoke with patient's daughter. She verbalized understanding of Incruse. She stated that she too had called the insurance and she was told that the Incruse is covered at 100%. She is interested in this RX. RX has been sent to the pharmacy.   While on the phone, she also mentioned that a RX for Symbicort had been sent to the pharmacy but it too needed a PA. Insurance informed her that they would cover Wixela at 100%.   She wanted to know if she still needs the Symbicort or related inhaler with the Incruse.   Dr. Vassie Loll, can you please advise? Thanks!

## 2022-05-22 MED ORDER — FLUTICASONE-SALMETEROL 250-50 MCG/ACT IN AEPB: 1.0000 | INHALATION_SPRAY | Freq: Two times a day (BID) | RESPIRATORY_TRACT | 5 refills | Status: DC

## 2022-05-22 NOTE — Telephone Encounter (Signed)
Dr. Vassie Loll, can you advise on the [facility] strength?

## 2022-05-22 NOTE — Addendum Note (Signed)
Addended by: Maurene Capes on: 05/22/2022 03:19 PM   Modules accepted: Orders

## 2022-05-22 NOTE — Telephone Encounter (Signed)
Called and spoke with patient's daughter. She verbalized understanding of instructions for Wixela. RX has been sent to the pharmacy.   Nothing further needed at time of call.

## 2022-06-06 ENCOUNTER — Telehealth: Payer: Self-pay | Admitting: Pulmonary Disease

## 2022-06-06 NOTE — Telephone Encounter (Signed)
ONO on CPAP/room air showed significant desaturation for almost 6 hours. She needs to continue oxygen blended into CPAP

## 2022-06-08 NOTE — Telephone Encounter (Signed)
Called and spoke with pt's daughter letting her know the results of pt's ONO and she verbalized understanding. Nothing further needed.

## 2022-07-14 ENCOUNTER — Telehealth: Payer: Self-pay | Admitting: Pulmonary Disease

## 2022-07-14 NOTE — Telephone Encounter (Signed)
Spk to daughter with provider recommendations, states pt doesn't currently see cardio and she will call PCP to get a referral   Nothing further

## 2022-07-14 NOTE — Telephone Encounter (Signed)
Spk with pt's daughter advised her PCP will need to prescribe rx. States this was a med she got in [place] and PCP will not prescribe.   Wanted to know if Latoya Foster will sent script for ivabradine (CORLANOR) 5 MG TABS tablet

## 2022-07-28 ENCOUNTER — Ambulatory Visit: Payer: Commercial Managed Care - HMO | Admitting: Cardiology

## 2022-08-06 NOTE — Progress Notes (Unsigned)
Patient referred by Leeroy Cha,* for coronary artery disease  Subjective:   Latoya Foster, female    DOB: Jun 26, 1951, 71 y.o.   MRN: [id number]   Chief Complaint  Patient presents with   Coronary Artery Disease   New Patient (Initial Visit)    HPI  71 y.o. [name] female with hypertension, coronary artery disease, asthma/bronchiectasis, here to establish care for management of CAD.  Patient recently moved to [place], lives with [name] who is a Lexicographer at [employer].  I reviewed prior hospitalization records from [place], [place].  Records of prior coronary angiogram not available.  It appears that she underwent coronary angiogram about 10 years ago, and had low ejection fraction at one point in time.  She has been on Plavix since then.  Her symptoms at that time were that of chest tightness, which have not occurred since then.  Last echocardiogram showed normal EF, grade 1 diastolic dysfunction, aortic sclerosis, PASP 38 mmHg (12/2020).  In addition, patient is also seeing Dr. Elsworth Soho for management of chronic hypoxic respiratory failure, OSA on CPAP, nighttime use of oxygen, with underlying bronchiectasis.  She has had mild exertional dyspnea with more than usual activity, which has not changed over the years.  She denies chest pain, orthopnea, PND, palpitations, presyncope, syncope.  She has mild leg edema, unchanged, with known varicose veins.  She is compliant with medical therapy.  Blood pressure is slightly elevated today.  She denies any bleeding issues.   History reviewed. No pertinent past medical history.   Past Surgical History:  Procedure Laterality Date   HERNIA REPAIR       Social History   Tobacco Use  Smoking Status Never  Smokeless Tobacco Never    Social History   Substance and Sexual Activity  Alcohol Use None     Family History  Problem Relation Age of Onset   Heart attack Father 35       first      Current  Outpatient Medications:    alendronate (FOSAMAX) 70 MG tablet, Take 70 mg by mouth once a week., Disp: , Rfl:    atorvastatin (LIPITOR) 40 MG tablet, Take 40 mg by mouth daily., Disp: , Rfl:    diltiazem (CARDIZEM SR) 60 MG 12 hr capsule, Take 60 mg by mouth 2 (two) times daily., Disp: , Rfl:    fluticasone-salmeterol (WIXELA INHUB) 250-50 MCG/ACT AEPB, Inhale 1 puff into the lungs in the morning and at bedtime., Disp: 60 each, Rfl: 5   HYDROcodone-homatropine (HYCODAN) 5-1.5 MG/5ML syrup, Take 5 mLs by mouth every 6 (six) hours as needed for cough., Disp: 120 mL, Rfl: 0   ivabradine (CORLANOR) 5 MG TABS tablet, Take 5 mg by mouth 2 (two) times daily with a meal., Disp: , Rfl:    montelukast (SINGULAIR) 10 MG tablet, Take 10 mg by mouth at bedtime., Disp: , Rfl:    telmisartan (MICARDIS) 40 MG tablet, Take 40 mg by mouth daily., Disp: , Rfl:    umeclidinium bromide (INCRUSE ELLIPTA) 62.5 MCG/ACT AEPB, Inhale 1 puff into the lungs daily., Disp: 30 each, Rfl: 5   Cardiovascular and other pertinent studies:  Reviewed external labs and tests, independently interpreted  EKG 08/07/2022: Sinus rhythm 74 bpm Normal EKG    Recent labs: 07/03/2022: Glucose 116, BUN/Cr 11/0.5. EGFR 98 K 4.3 Chol 160, TG 84, HDL 64, LDL 81    Review of Systems  Cardiovascular:  Positive for dyspnea on exertion. Negative for chest  pain, leg swelling, palpitations and syncope.        Vitals:   08/07/22 0851  BP: (!) 144/59  Pulse: 77  Resp: 16  Temp: 97.7 F (36.5 C)  SpO2: 95%     Body mass index is 24.67 kg/m. Filed Weights   08/07/22 0851  Weight: 114 lb (51.7 kg)     Objective:   Physical Exam Vitals and nursing note reviewed.  Constitutional:      General: She is not in acute distress. Neck:     Vascular: No JVD.  Cardiovascular:     Rate and Rhythm: Normal rate and regular rhythm.     Heart sounds: Normal heart sounds. No murmur heard. Pulmonary:     Effort: Pulmonary effort is  normal.     Breath sounds: Normal breath sounds. No wheezing or rales.  Musculoskeletal:     Right lower leg: Edema (Trace) present.     Left lower leg: Edema (Trace) present.  Skin:    Comments: Prominent varicosities b/l legs          Visit diagnoses:   ICD-10-CM   1. Coronary artery disease involving native coronary artery of native heart without angina pectoris  I25.10 EKG 12-Lead    CT CORONARY MORPH W/CTA COR W/SCORE W/CA W/CM &/OR WO/CM    Basic metabolic panel    metoprolol tartrate (LOPRESSOR) 25 MG tablet    DISCONTINUED: metoprolol tartrate (LOPRESSOR) 25 MG tablet    2. Aortic valve sclerosis  I35.8 PCV ECHOCARDIOGRAM COMPLETE    3. Bruit  R09.89 PCV CAROTID DUPLEX (BILATERAL)       Orders Placed This Encounter  Procedures   CT CORONARY MORPH W/CTA COR W/SCORE W/CA W/CM &/OR WO/CM   Basic metabolic panel   EKG 66-AYTK   PCV ECHOCARDIOGRAM COMPLETE   PCV CAROTID DUPLEX (BILATERAL)     Meds ordered this encounter  Medications   DISCONTD: metoprolol tartrate (LOPRESSOR) 25 MG tablet    Sig: Take 1 tablet (25 mg total) by mouth 2 (two) times daily. Start 2 days before the CT scan, take it on the morning of CTA scan 2 hour before the scan, stop thereafter    Dispense:  10 tablet    Refill:  1   metoprolol tartrate (LOPRESSOR) 25 MG tablet    Sig: Take 1 tablet (25 mg total) by mouth 2 (two) times daily. Start 2 days before the CT scan, take it on the morning of CTA scan 2 hour before the scan, stop thereafter    Dispense:  10 tablet    Refill:  1   aspirin EC 81 MG tablet    Sig: Take 1 tablet (81 mg total) by mouth daily. Swallow whole.    Dispense:  30 tablet    Refill:  3     Assessment & Recommendations:   71 y.o. [name] female with hypertension, coronary artery disease, asthma/bronchiectasis, here to establish care for management of CAD.  CAD: No current anginal symptoms, unchanged exertional dyspnea symptoms likely more due to pulmonary  etiology. Currently on Plavix, ever since coronary angiogram 10 years ago. I will obtain coronary CT angiogram for evaluation of coronary anatomy.  This will aid in decision making regarding long-term use of antiplatelet therapy.  For now, change Plavix to aspirin 81 mg daily. Discussed use of metoprolol for periprocedural use around the time of coronary CT angiogram. Continue Lipitor 40 mg daily.  It appears that she may have had had low EF at  some point in time.  However, at least 2 serial echocardiograms 3 years apart have showed normal EF.  We will discontinue ivabradine.  Okay to continue telmisartan, as an antihypertensive agent.  Aortic sclerosis: Noted on echocardiogram in 12/2020.  On exam, this probably has progressed to mild aortic stenosis.  Will obtain echocardiogram.  Carotid bruit: Bilateral carotid bruit, either due to carotid artery disease or conducted murmur from aortic stenosis. We will check carotid artery duplex ultrasound.  Hypertension: Blood pressure slightly elevated today.  No change made today, but recommend regular home monitoring.  If systolic blood pressure consistently stays >140 mmHg, may need further titration of antihypertensive therapy.    Varicose veins: Suspect venous insufficiency without overt symptoms.  Recommend compression stockings, leg elevation at night.  Further recommendations after above testing.  Thank you for referring the patient to [place]. Please feel free to contact with any questions.   Nigel Mormon, MD Pager: [phone] Office: [phone]

## 2022-08-07 ENCOUNTER — Ambulatory Visit: Payer: Commercial Managed Care - HMO | Admitting: Cardiology

## 2022-08-07 ENCOUNTER — Encounter: Payer: Self-pay | Admitting: Cardiology

## 2022-08-07 VITALS — BP 144/59 | HR 77 | Temp 97.7°F | Resp 16 | Ht <= 58 in | Wt 114.0 lb

## 2022-08-07 DIAGNOSIS — I358 Other nonrheumatic aortic valve disorders: Secondary | ICD-10-CM

## 2022-08-07 DIAGNOSIS — I2510 Atherosclerotic heart disease of native coronary artery without angina pectoris: Secondary | ICD-10-CM

## 2022-08-07 DIAGNOSIS — R0989 Other specified symptoms and signs involving the circulatory and respiratory systems: Secondary | ICD-10-CM | POA: Insufficient documentation

## 2022-08-07 MED ORDER — ASPIRIN 81 MG PO TBEC: 81.0000 mg | DELAYED_RELEASE_TABLET | Freq: Every day | ORAL | 3 refills | Status: DC

## 2022-08-07 MED ORDER — METOPROLOL TARTRATE 25 MG PO TABS: 25.0000 mg | ORAL_TABLET | Freq: Two times a day (BID) | ORAL | 1 refills | Status: DC

## 2022-08-07 NOTE — Patient Instructions (Signed)
Your cardiac CT will be scheduled at the locations below:   [facility]  [address]  [place], [place] [address]  [phone]    If scheduled at [facility], please arrive at the [facility] main entrance of [facility] 30-45 minutes prior to test start time.  Proceed to the [facility] Radiology Department (first floor) to check-in and test prep.   Please follow these instructions carefully (unless otherwise directed):   Hold all erectile dysfunction medications at least 3 days (72 hrs) prior to test.   On the Night Before the Test:   Be sure to Drink plenty of water.   Do not consume any caffeinated/decaffeinated beverages or chocolate 12 hours prior to your test.   Do not take any antihistamines 12 hours prior to your test.   If the patient has contrast allergy:  Patient will need a prescription for Prednisone and very clear instructions (as follows):  1. Prednisone 50 mg - take 13 hours prior to test  2. Take another Prednisone 50 mg 7 hours prior to test  3. Take another Prednisone 50 mg 1 hour prior to test  4. Take Benadryl 50 mg 1 hour prior to test   Patient must complete all four doses of above prophylactic medications.   Patient will need a ride after test due to Benadryl.   On the Day of the Test:   Drink plenty of water. Do not drink any water within one hour of the test.   Do not eat any food 4 hours prior to the test.   You may take your regular medications prior to the test.    - Metoprolol tartarate 25 mg as directed You may stop it after the CT scan, unless specified otherwise by me.    FEMALES- please wear underwire-free bra if available          After the Test:   Drink plenty of water.   After receiving IV contrast, you may experience a mild flushed feeling. This is normal.   On occasion, you may experience a mild rash up to 24 hours after the test. This is not dangerous. If this occurs, you can take  Benadryl 25 mg and increase your fluid intake.   If you experience trouble breathing, this can be serious. If it is severe call [phone] IMMEDIATELY. If it is mild, please call our office.   If you take any of these medications: Glipizide/Metformin, Avandament, Glucavance, please do not take 48 hours after completing test unless otherwise instructed.     Please contact the cardiac imaging nurse navigator should you have any questions/concerns  Rockwell Alexandria, RN Navigator Cardiac Imaging  [facility]  [phone] Office  [phone] Cell

## 2022-08-24 ENCOUNTER — Other Ambulatory Visit: Payer: Self-pay | Admitting: Cardiology

## 2022-08-24 DIAGNOSIS — I2510 Atherosclerotic heart disease of native coronary artery without angina pectoris: Secondary | ICD-10-CM

## 2022-09-02 LAB — BASIC METABOLIC PANEL
BUN/Creatinine Ratio: 24 (ref 12–28)
BUN: 13 mg/dL (ref 8–27)
CO2: 27 mmol/L (ref 20–29)
Calcium: 9.8 mg/dL (ref 8.7–10.3)
Chloride: 99 mmol/L (ref 96–106)
Creatinine, Ser: 0.54 mg/dL — ABNORMAL LOW (ref 0.57–1.00)
Glucose: 194 mg/dL — ABNORMAL HIGH (ref 70–99)
Potassium: 4.7 mmol/L (ref 3.5–5.2)
Sodium: 140 mmol/L (ref 134–144)
eGFR: 97 mL/min/{1.73_m2} (ref >59)

## 2022-09-07 ENCOUNTER — Telehealth (HOSPITAL_COMMUNITY): Payer: Self-pay | Admitting: Emergency Medicine

## 2022-09-07 NOTE — Telephone Encounter (Signed)
Attempted to call patient regarding upcoming cardiac CT appointment. °Left message on voicemail with name and callback number °Feiga Nadel RN Navigator Cardiac Imaging °[facility] °[phone] Office °[phone] Cell ° °

## 2022-09-08 ENCOUNTER — Ambulatory Visit: Payer: Commercial Managed Care - HMO

## 2022-09-08 ENCOUNTER — Ambulatory Visit (HOSPITAL_COMMUNITY)
Admission: RE | Admit: 2022-09-08 | Discharge: 2022-09-08 | Disposition: A | Discharge status: Home / Self Care | Payer: Commercial Managed Care - HMO | Source: Ambulatory Visit | Attending: Cardiology | Admitting: Cardiology

## 2022-09-08 DIAGNOSIS — R0989 Other specified symptoms and signs involving the circulatory and respiratory systems: Secondary | ICD-10-CM

## 2022-09-08 DIAGNOSIS — I2510 Atherosclerotic heart disease of native coronary artery without angina pectoris: Secondary | ICD-10-CM | POA: Insufficient documentation

## 2022-09-08 DIAGNOSIS — I700 Atherosclerosis of aorta: Secondary | ICD-10-CM | POA: Insufficient documentation

## 2022-09-08 DIAGNOSIS — I358 Other nonrheumatic aortic valve disorders: Secondary | ICD-10-CM

## 2022-09-08 MED ORDER — NITROGLYCERIN 0.4 MG SL SUBL
0.8000 mg | SUBLINGUAL_TABLET | Freq: Once | SUBLINGUAL | Status: AC
  Administered 2022-09-08: 0.8 mg via SUBLINGUAL

## 2022-09-08 MED ORDER — NITROGLYCERIN 0.4 MG SL SUBL
SUBLINGUAL_TABLET | SUBLINGUAL | Status: AC
  Filled 2022-09-08: qty 2

## 2022-09-08 MED ORDER — IOHEXOL 350 MG/ML SOLN
100.0000 mL | Freq: Once | INTRAVENOUS | Status: AC | PRN
  Administered 2022-09-08: 100 mL via INTRAVENOUS

## 2022-09-15 NOTE — Progress Notes (Signed)
Minimal plaque in carotid arteries. Continue Aspirin and Lipitor.  Thanks MJP

## 2022-09-15 NOTE — Progress Notes (Signed)
Normal pumping function of the heart. No severe heart valve abnormalities noted.  ° ° °Thanks °MJP ° °

## 2022-09-15 NOTE — Progress Notes (Signed)
Mild nonobstructive coronary artery disease Continue Aspirin 81 mg daily, Lipitor 40 mg daily. Plavix not needed.  Thanks MJP

## 2022-09-17 NOTE — Progress Notes (Signed)
Called patient no answer, left a vm

## 2022-09-17 NOTE — Progress Notes (Signed)
Called patient no answer left a vm

## 2022-09-17 NOTE — Progress Notes (Signed)
2nd attempt : Called patient, Na, LMAM

## 2022-09-17 NOTE — Progress Notes (Signed)
2nd attempt : Called patient, NA, LMAM

## 2022-09-18 ENCOUNTER — Telehealth: Payer: Self-pay

## 2022-09-18 NOTE — Telephone Encounter (Signed)
Patients daughter called and I gave her both echo and CTA results

## 2022-09-23 ENCOUNTER — Other Ambulatory Visit: Payer: Self-pay | Admitting: Pediatrics

## 2022-09-23 MED ORDER — TYPHOID VACCINE PO CPDR: 1.0000 | DELAYED_RELEASE_CAPSULE | ORAL | 0 refills | Status: DC

## 2022-09-23 MED ORDER — MEFLOQUINE HCL 250 MG PO TABS: 250.0000 mg | ORAL_TABLET | ORAL | 0 refills | Status: DC

## 2022-11-25 ENCOUNTER — Other Ambulatory Visit: Payer: Self-pay | Admitting: Pulmonary Disease

## 2022-12-29 ENCOUNTER — Other Ambulatory Visit: Payer: Self-pay | Admitting: Internal Medicine

## 2022-12-29 DIAGNOSIS — Z1231 Encounter for screening mammogram for malignant neoplasm of breast: Secondary | ICD-10-CM

## 2023-01-12 ENCOUNTER — Encounter (HOSPITAL_BASED_OUTPATIENT_CLINIC_OR_DEPARTMENT_OTHER): Payer: Self-pay | Admitting: Pulmonary Disease

## 2023-01-12 ENCOUNTER — Ambulatory Visit (HOSPITAL_BASED_OUTPATIENT_CLINIC_OR_DEPARTMENT_OTHER): Payer: Commercial Managed Care - HMO | Admitting: Pulmonary Disease

## 2023-01-12 VITALS — BP 134/66 | HR 88 | Temp 97.8°F | Ht <= 58 in | Wt 115.8 lb

## 2023-01-12 DIAGNOSIS — G4733 Obstructive sleep apnea (adult) (pediatric): Secondary | ICD-10-CM

## 2023-01-12 DIAGNOSIS — J479 Bronchiectasis, uncomplicated: Secondary | ICD-10-CM | POA: Diagnosis not present

## 2023-01-12 DIAGNOSIS — J45909 Unspecified asthma, uncomplicated: Secondary | ICD-10-CM | POA: Diagnosis not present

## 2023-01-12 NOTE — Progress Notes (Signed)
   Subjective:    Patient ID: Latoya Foster, female    DOB: 07/27/1951, 72 y.o.   MRN: [id number]  HPI  72 yo never smoker for FU of bronchiectasis, "COPD/asthma" chronic hypoxic respiratory failure , OSA on CPAP Her daughter Bobbie Stack is a pediatrician at [place] -hypertension, hyperlipidemia, angina, on Plavix   In 2020 she was hospitalized in [place] and found to be hypoxic to 80% on room air and started on daytime oxygen  She has since weaned off daytime oxygen but uses it at night  67-monthfollow-up visit. Symbicort and Spiriva were not covered under insurance and we authorized switch to advair and Incruse She established with cardiology, reviewed daughter consultation 07/2022, ivabradine was discontinued, telmisartan was continued  CT coronaries 08/2022 showed atelectasis versus scarring in bilateral lower lobes right middle lobe and lingula, paraseptal and centrilobular emphysema, calcium score was 212/67 percentile  She arrives with her daughter today, without oxygen.  Oxygen saturation is 97%. [place] and did well.  She did not oxygen and had used it during sleep.  She admits to sporadic compliance with CPAP but is very compliant with oxygen during sleep. She takes Advair twice daily.  She was able to get tiotropium generic [place]Tiova[place]  and she is unclear on the dose.  She complains of dry mouth  Significant tests/ events reviewed   05/2022 ONO on CPAP/room air showed significant desaturation for almost 6 hours.  PFTs 08/2021 >> severe airway obstruction, ratio 68, FEV1 34%, FVC 40%, no bronchodilator response, TLC 74%, DLCO 7.8/26%   09/2021 CPAP titration study showed pressure requirements up to 17 cm, no O2 required   HST 06/2021 - moderate OSA AHI 24/hour Nadir lowest saturation was 62%   NPSG 2016-AHI 30/hour with desaturation 62% out of proportion to degree of OSA, corrected by CPAP 12  cm   CT angiogram chest 02/2018 bronchiectasis of right middle lobe and lingula, dilated pulmonary artery 31 mm  Echo 08/2022 nml LVEF, no evidence of pulm hypertensionEcho 2022 RVSP 38 Echo 02/2018 RVSP 67 with severe TR  Review of Systems neg for any significant sore throat, dysphagia, itching, sneezing, nasal congestion or excess/ purulent secretions, fever, chills, sweats, unintended wt loss, pleuritic or exertional cp, hempoptysis, orthopnea pnd or change in chronic leg swelling. Also denies presyncope, palpitations, heartburn, abdominal pain, nausea, vomiting, diarrhea or change in bowel or urinary habits, dysuria,hematuria, rash, arthralgias, visual complaints, headache, numbness weakness or ataxia.     Objective:   Physical Exam  Gen. Pleasant, well-nourished, in no distress ENT - no thrush, no pallor/icterus,no post nasal drip Neck: No JVD, no thyromegaly, no carotid bruits Lungs: no use of accessory muscles, no dullness to percussion, clear without rales or rhonchi  Cardiovascular: Rhythm regular, heart sounds  normal, no murmurs or gallops, no peripheral edema Musculoskeletal: No deformities, no cyanosis or clubbing        Assessment & Plan:

## 2023-01-12 NOTE — Assessment & Plan Note (Signed)
Continue nocturnal CPAP.  We discussed alternative of hypoglossal nerve stimulator implant, she is understandably hesitant and will think about this -Continue nocturnal oxygen based on last oximetry

## 2023-01-12 NOTE — Assessment & Plan Note (Signed)
Treating her as COPD/asthma with triple therapy.  Advair is being tolerated well. She will continue on tiotropium generic inhaler that she obtained from [place].  This is an MDI and I clarified dose  to be 9 mcg 1 puff 2

## 2023-01-12 NOTE — Patient Instructions (Addendum)
Tiova inhaler is 1 puff daily  We discussed inspire implant

## 2023-01-12 NOTE — Assessment & Plan Note (Signed)
She has done well without exacerbations in the past 2 years. We discussed plan should she develop lower respiratory infection

## 2023-01-29 ENCOUNTER — Encounter: Payer: Self-pay | Admitting: Cardiology

## 2023-01-29 ENCOUNTER — Ambulatory Visit: Payer: Commercial Managed Care - HMO | Admitting: Cardiology

## 2023-01-29 VITALS — BP 132/63 | HR 86 | Resp 16 | Ht <= 58 in | Wt 113.0 lb

## 2023-01-29 DIAGNOSIS — I10 Essential (primary) hypertension: Secondary | ICD-10-CM

## 2023-01-29 DIAGNOSIS — I2510 Atherosclerotic heart disease of native coronary artery without angina pectoris: Secondary | ICD-10-CM

## 2023-01-29 MED ORDER — TELMISARTAN 40 MG PO TABS: 40.0000 mg | ORAL_TABLET | Freq: Every day | ORAL | 3 refills | Status: AC

## 2023-01-29 NOTE — Progress Notes (Signed)
Patient referred by Latoya Foster,* for coronary artery disease  Subjective:   Latoya Foster, female    DOB: 1951/10/13, 72 y.o.   MRN: [id number]   Chief Complaint  Patient presents with   Coronary Artery Disease   Follow-up    6 month    HPI  72 y.o. Latoya Foster female with hypertension, coronary artery disease, asthma/bronchiectasis, here to establish care for management of CAD.  Patient is doing well, denies chest pain, shortness of breath, palpitations, leg edema, orthopnea, PND, TIA/syncope. She is taking diltiazem only once. Blood pressure controlled in spite of that. She reports itching in her legs, has had varicose veins. She is leaving for [place] next month, will be there for 6 months.    Initial consultation visit 07/2022: Patient recently moved to [place], lives with her daughter Latoya Foster who is a pediatrician at [employer].  I reviewed prior hospitalization records from [place], [place].  Records of prior coronary angiogram not available.  It appears that she underwent coronary angiogram about 10 years ago, and had low ejection fraction at one point in time.  She has been on Plavix since then.  Her symptoms at that time were that of chest tightness, which have not occurred since then.  Last echocardiogram showed normal EF, grade 1 diastolic dysfunction, aortic sclerosis, PASP 38 mmHg (12/2020).  In addition, patient is also seeing Dr. Elsworth Foster for management of chronic hypoxic respiratory failure, OSA on CPAP, nighttime use of oxygen, with underlying bronchiectasis.  She has had mild exertional dyspnea with more than usual activity, which has not changed over the years.  She denies chest pain, orthopnea, PND, palpitations, presyncope, syncope.  She has mild leg edema, unchanged, with known varicose veins.  She is compliant with medical therapy.  Blood pressure is slightly elevated today.  She denies any bleeding issues.   History reviewed. No pertinent past medical  history.   Past Surgical History:  Procedure Laterality Date   HERNIA REPAIR       Social History   Tobacco Use  Smoking Status Never  Smokeless Tobacco Never    Social History   Substance and Sexual Activity  Alcohol Use Never     Family History  Problem Relation Age of Onset   Heart attack Father 5       first      Current Outpatient Medications:    alendronate (FOSAMAX) 70 MG tablet, Take 70 mg by mouth once a week., Disp: , Rfl:    aspirin EC 81 MG tablet, Take 1 tablet (81 mg total) by mouth daily. Swallow whole. (Patient not taking: Reported on 01/12/2023), Disp: 30 tablet, Rfl: 3   atorvastatin (LIPITOR) 40 MG tablet, Take 40 mg by mouth daily., Disp: , Rfl:    diltiazem (CARDIZEM SR) 60 MG 12 hr capsule, Take 60 mg by mouth 2 (two) times daily., Disp: , Rfl:    fluticasone-salmeterol (ADVAIR) 250-50 MCG/ACT AEPB, INHALE 1 PUFF INTO THE LUNGS IN THE MORNING AND AT BEDTIME, Disp: 60 each, Rfl: 5   mefloquine (LARIAM) 250 MG tablet, Take 1 tablet (250 mg total) by mouth every 7 (seven) days., Disp: 12 tablet, Rfl: 0   metoprolol tartrate (LOPRESSOR) 25 MG tablet, TAKE 1 TABLET BY MOUTH TWICE DAILY FOR 2 DAYS BEFORE CT SCAN, 2 HOURS PRIOR TO SCAN, THEN STOP, Disp: 10 tablet, Rfl: 1   montelukast (SINGULAIR) 10 MG tablet, Take 10 mg by mouth at bedtime., Disp: , Rfl:    telmisartan (MICARDIS)  40 MG tablet, Take 40 mg by mouth daily., Disp: , Rfl:    TIOTROPIUM BROMIDE MONOHYDRATE IN, Inhale into the lungs., Disp: , Rfl:    typhoid (VIVOTIF) DR capsule, Take 1 capsule by mouth every other day., Disp: 4 capsule, Rfl: 0   umeclidinium bromide (INCRUSE ELLIPTA) 62.5 MCG/ACT AEPB, Inhale 1 puff into the lungs daily., Disp: 30 each, Rfl: 5   Cardiovascular and other pertinent studies:  Reviewed external labs and tests, independently interpreted  EKG 01/29/2023: Sinus rhythm 75 bpm Normal EKG  CCTA 09/08/2022: 1. Total coronary calcium score of 212, this places the  patient at the 67th percentile if scored for Caucasian patients. No established percentile in the [place] for non Chinese Asian patient.   2. Normal coronary origin with co-dominance.   3. CAD-RADS = 1.   Left Main: minimal calcified plaque without stenosis.   LAD: Minimal luminal irregularities within the proximal / mid segment due to calcific and non-calcified plaque without evidence of stenosis.   LCX: Patent.   RCA: Patent, with minimal luminal irregularities without evidence of stenosis.   4.  Aortic atherosclerosis  Echocardiogram 09/08/2022: Normal LV systolic function with visual EF 60-65%. Left ventricle cavity is normal in size. Normal left ventricular wall thickness. Normal global wall motion. Normal diastolic filling pattern, normal LAP. Calculated EF 69%. Trileaflet aortic valve with no regurgitation. Mild aortic valve leaflet calcification. Structurally normal tricuspid valve with trace regurgitation. No evidence of pulmonary hypertension. No prior available for comparison.  Carotid artery duplex 09/08/2022: Duplex suggests stenosis in the right internal carotid artery (minimal). Duplex suggests stenosis in the left internal carotid artery (1-15%). Antegrade right vertebral artery flow. Antegrade left vertebral artery flow. There is very mild homogeneous plaque noted in bilateral carotid arteries.    Recent labs: 07/03/2022: Glucose 116, BUN/Cr 11/0.5. EGFR 98 K 4.3 Chol 160, TG 84, HDL 64, LDL 81    Review of Systems  Cardiovascular:  Negative for chest pain, dyspnea on exertion, leg swelling, palpitations and syncope.  Skin:  Positive for itching.        Vitals:   01/29/23 1145  BP: (!) 123/45  Resp: 16     Body mass index is 24.45 kg/m. Filed Weights   01/29/23 1145  Weight: 113 lb (51.3 kg)     Objective:   Physical Exam Vitals and nursing note reviewed.  Constitutional:      General: She is not in acute distress. Neck:      Vascular: No JVD.  Cardiovascular:     Rate and Rhythm: Normal rate and regular rhythm.     Heart sounds: Normal heart sounds. No murmur heard. Pulmonary:     Effort: Pulmonary effort is normal.     Breath sounds: Normal breath sounds. No wheezing or rales.  Musculoskeletal:     Right lower leg: Edema (Trace) present.     Left lower leg: Edema (Trace) present.  Skin:    Comments: Prominent varicosities b/l legs          Visit diagnoses:   ICD-10-CM   1. Coronary artery disease involving native coronary artery of native heart without angina pectoris  I25.10 EKG 12-Lead       Orders Placed This Encounter  Procedures   EKG 12-Lead     No orders of the defined types were placed in this encounter.    Assessment & Recommendations:   72 y.o. Latoya Foster female with hypertension, coronary artery disease, asthma/bronchiectasis, here to establish care  for management of CAD.  CAD: Calcium score 212. Minimal luminal irregularities.  Does not need Aspirin daily, okay to take every other day.  Continue Lipitor 40 mg daily.  Aortic sclerosis: Minimal.  Hypertension: Controlled. Can come off diltiazem SR 60 mg, which she currently takes only daily.  Refilled Telmisartan 40 mg daily.   Varicose veins: Suspect venous insufficiency without overt symptoms.  Recommend compression stockings on a daily basis, leg elevation at night. I will see her back in 08/2023 after she returns from [place]. If symptoms remain, will refer for endovenous ablation.  F/u in 08/2023.   Nigel Mormon, MD Pager: [phone] Office: [phone]

## 2023-02-02 ENCOUNTER — Ambulatory Visit
Admission: RE | Admit: 2023-02-02 | Discharge: 2023-02-02 | Disposition: A | Discharge status: Home / Self Care | Payer: Commercial Managed Care - HMO | Source: Ambulatory Visit | Attending: Internal Medicine | Admitting: Internal Medicine

## 2023-02-02 DIAGNOSIS — Z1231 Encounter for screening mammogram for malignant neoplasm of breast: Secondary | ICD-10-CM

## 2023-02-04 ENCOUNTER — Other Ambulatory Visit: Payer: Self-pay | Admitting: Internal Medicine

## 2023-02-04 DIAGNOSIS — R928 Other abnormal and inconclusive findings on diagnostic imaging of breast: Secondary | ICD-10-CM

## 2023-02-05 ENCOUNTER — Ambulatory Visit: Payer: Commercial Managed Care - HMO | Admitting: Cardiology

## 2023-04-27 ENCOUNTER — Ambulatory Visit
Admission: RE | Admit: 2023-04-27 | Discharge: 2023-04-27 | Disposition: A | Discharge status: Home / Self Care | Payer: Commercial Managed Care - HMO | Source: Ambulatory Visit | Attending: Internal Medicine | Admitting: Internal Medicine

## 2023-04-27 ENCOUNTER — Other Ambulatory Visit: Payer: Self-pay | Admitting: Internal Medicine

## 2023-04-27 DIAGNOSIS — R928 Other abnormal and inconclusive findings on diagnostic imaging of breast: Secondary | ICD-10-CM

## 2023-09-10 ENCOUNTER — Ambulatory Visit: Payer: Commercial Managed Care - HMO | Attending: Cardiology | Admitting: Cardiology

## 2023-09-10 ENCOUNTER — Encounter: Payer: Self-pay | Admitting: Cardiology

## 2023-09-10 VITALS — BP 130/70 | HR 66 | Resp 16 | Ht <= 58 in | Wt 119.4 lb

## 2023-09-10 DIAGNOSIS — Z79899 Other long term (current) drug therapy: Secondary | ICD-10-CM | POA: Diagnosis not present

## 2023-09-10 DIAGNOSIS — I2510 Atherosclerotic heart disease of native coronary artery without angina pectoris: Secondary | ICD-10-CM | POA: Diagnosis not present

## 2023-09-10 DIAGNOSIS — E782 Mixed hyperlipidemia: Secondary | ICD-10-CM

## 2023-09-10 DIAGNOSIS — I872 Venous insufficiency (chronic) (peripheral): Secondary | ICD-10-CM | POA: Diagnosis not present

## 2023-09-10 MED ORDER — ASPIRIN 81 MG PO TBEC: 81.0000 mg | DELAYED_RELEASE_TABLET | Freq: Every day | ORAL | Status: DC

## 2023-09-10 NOTE — Progress Notes (Addendum)
Cardiology Office Note:  .   Date:  09/10/2023  ID:  Latoya Foster, DOB August 14, 1951, MRN [id number] PCP: Latoya Ishihara, MD   [facility] Providers Cardiologist:  Latoya Mainland, MD PCP: Latoya Ishihara, MD  Chief Complaint  Patient presents with   Coronary Artery Disease      History of Present Illness: .    Latoya Foster is a 72 y.o. female with hypertension, coronary artery disease, asthma/bronchiectasis   Patient is here today with her daughter Latoya Foster.  Patient return from a months in [place], where she ate out at any restaurants more often than usual.  She denies any chest pain.  She continues to have exertional dyspnea, mild and unchanged from prior.  However, today she noticed that her oxygen saturation dropped to 88% on walking, improved with resting.  She has seen Dr. Vassie Loll before for possible asthma/bronchiectasis.  Initial consultation visit 07/2022: Patient recently moved to [place], lives with her daughter Latoya Foster, who is a Optometrist at [employer].  I reviewed prior hospitalization records from [place], [place].  Records of prior coronary angiogram not available.  It appears that she underwent coronary angiogram about 10 years ago, and had low ejection fraction at one point in time.  She has been on Plavix since then.  Her symptoms at that time were that of chest tightness, which have not occurred since then.  Last echocardiogram showed normal EF, grade 1 diastolic dysfunction, aortic sclerosis, PASP 38 mmHg (12/2020).  In addition, patient is also seeing Dr. Vassie Loll for management of chronic hypoxic respiratory failure, OSA on CPAP, nighttime use of oxygen, with underlying bronchiectasis.  She has had mild exertional dyspnea with more than usual activity, which has not changed over the years.  She denies chest pain, orthopnea, PND, palpitations, presyncope, syncope.  She has mild leg edema, unchanged, with known varicose veins.  She is compliant with  medical therapy.  Blood pressure is slightly elevated today.  She denies any bleeding issues.    Vitals:   09/10/23 1107  BP: 130/70  Pulse: 66  Resp: 16  SpO2: 95%     ROS:  Review of Systems  Cardiovascular:  Positive for dyspnea on exertion. Negative for chest pain, leg swelling, palpitations and syncope.     Studies Reviewed: Marland Kitchen       EKG 01/29/2023: Sinus rhythm 75 bpm Normal EKG  Lipid panel 06/2022: Chol 160, TG 84, HDL 64, LDL 81    Physical Exam:   Physical Exam Vitals and nursing note reviewed.  Constitutional:      General: She is not in acute distress. Neck:     Vascular: No JVD.  Cardiovascular:     Rate and Rhythm: Normal rate and regular rhythm.     Heart sounds: Normal heart sounds. No murmur heard. Pulmonary:     Effort: Pulmonary effort is normal.     Breath sounds: Normal breath sounds. No wheezing or rales.  Musculoskeletal:     Right lower leg: No edema.     Left lower leg: No edema.  Skin:    Comments: Stasis dermatitis      VISIT DIAGNOSES:   ICD-10-CM   1. Coronary artery disease involving native coronary artery of native heart without angina pectoris  I25.10 aspirin EC 81 MG tablet    Lipid Profile    AMB Referral to [facility]    Lipid Profile    CANCELED: AMB Referral to [facility] Pharm[facility]    2. Medication management  [phone]  aspirin EC 81 MG tablet    Lipid Profile    AMB Referral to [facility]    Lipid Profile    CANCELED: AMB Referral to [facility] Pharm[facility]    3. Hyperlipidemia, unspecified hyperlipidemia type  E78.5 aspirin EC 81 MG tablet    Lipid Profile    AMB Referral to [facility]    Lipid Profile    CANCELED: AMB Referral to [facility] Pharm[facility]       ASSESSMENT AND PLAN: .    [name] is a 72 y.o. female with hypertension, coronary artery disease, asthma/bronchiectasis   CAD: Calcium score 212, including calcium and left main. Minimal luminal irregularities.  Patient was previously not  taking aspirin.  Given her calcium and left main, recommend aspirin 81 mg daily, at least every other day.   Continue Lipitor 40 mg daily.  LDL slightly increased to 91.  There was some recent dietary indiscretions.  Discussed dietary changes with reducing saturated fat intake.  Will repeat lipid panel in 3 months.  If LDL remains greater than 70, she will need more aggressive therapy.  Given Southeast Asian ethnicity and increased risk of myalgias at higher doses of Lipitor, will consider addition of other nonstatin options.  In terms of injectables, inclisiran with once every 6 months dosing is more attractive option for convenience.  I will refer to Pharm.D. after repeat lipid panel in 3 months.   Exertional dyspnea: Seen by Dr. Cathi Roan before for possible bronchiectasis/COPD.  Recently, she has noted hypoxia on exertion with oxygen saturation dropping to 88%.  She currently uses oxygen only at night, but not during daytime.  I will refer her back to Dr. Vassie Loll to consider further evaluation and consideration for use of oxygen on ambulation.  Aortic sclerosis: Minimal.   Hypertension: Controlled. Continue Telmisartan 40 mg daily.    Varicose veins/venous insufficieny: Stasis dermatitis without any other symptoms.  Continue compression stockings.   Meds ordered this encounter  Medications   aspirin EC 81 MG tablet    Sig: Take 1 tablet (81 mg total) by mouth daily. Swallow whole. You may start out taking this every other day,  as tolerated.     F/u in 1 year  Signed, Elder Negus, MD

## 2023-09-10 NOTE — Patient Instructions (Signed)
Medication Instructions:   PLEASE START TAKING ASPIRIN 81 MG BY MOUTH DAILY--YOU CAN TAKE THIS AT FIRST EVERY OTHER DAY AND AS TOLERATED THEREAFTER  *If you need a refill on your cardiac medications before your next appointment, please call your pharmacy*   You have been referred to OUR PHARMACIST IN [facility] FOR MEDICATION/LIPID MANAGEMENT--Schedule PharmD appt for 3 1/2 months out, after pt has had 3 month lipid panel done per Dr. Rosemary Holms     Lab Work:  1.)  IN 3 MONTHS HERE IN THE OFFICE FOLLOWED WITH AN APPOINTMENT WITH OUR PHARMACIST IN [facility]  If you have labs (blood work) drawn today and your tests are completely normal, you will receive your results only by: MyChart Message (if you have MyChart) OR A paper copy in the mail If you have any lab test that is abnormal or we need to change your treatment, we will call you to review the results.    Follow-Up: At [facility], you and your health needs are our priority.  As part of our continuing mission to provide you with exceptional heart care, we have created designated Provider Care Teams.  These Care Teams include your primary Cardiologist (physician) and Advanced Practice Providers (APPs -  Physician Assistants and Nurse Practitioners) who all work together to provide you with the care you need, when you need it.  We recommend signing up for the patient portal called "MyChart".  Sign up information is provided on this After Visit Summary.  MyChart is used to connect with patients for Virtual Visits (Telemedicine).  Patients are able to view lab/test results, encounter notes, upcoming appointments, etc.  Non-urgent messages can be sent to your provider as well.   To learn more about what you can do with MyChart, go to [email or web address].    Your next appointment:   1 year(s)  Provider:   DR. Rosemary Holms

## 2023-11-12 ENCOUNTER — Ambulatory Visit: Payer: Commercial Managed Care - HMO

## 2023-11-12 ENCOUNTER — Ambulatory Visit
Admission: RE | Admit: 2023-11-12 | Discharge: 2023-11-12 | Disposition: A | Discharge status: Home / Self Care | Payer: Commercial Managed Care - HMO | Source: Ambulatory Visit | Attending: Internal Medicine | Admitting: Internal Medicine

## 2023-11-12 DIAGNOSIS — R928 Other abnormal and inconclusive findings on diagnostic imaging of breast: Secondary | ICD-10-CM

## 2023-11-15 ENCOUNTER — Other Ambulatory Visit: Payer: Self-pay | Admitting: Internal Medicine

## 2023-11-15 DIAGNOSIS — N6320 Unspecified lump in the left breast, unspecified quadrant: Secondary | ICD-10-CM

## 2023-11-23 ENCOUNTER — Ambulatory Visit (HOSPITAL_COMMUNITY)
Admission: EM | Admit: 2023-11-23 | Discharge: 2023-11-23 | Disposition: A | Discharge status: Home / Self Care | Payer: Commercial Managed Care - HMO | Attending: Family Medicine | Admitting: Family Medicine

## 2023-11-23 ENCOUNTER — Encounter (HOSPITAL_COMMUNITY): Payer: Self-pay | Admitting: Emergency Medicine

## 2023-11-23 ENCOUNTER — Ambulatory Visit (INDEPENDENT_AMBULATORY_CARE_PROVIDER_SITE_OTHER): Payer: Commercial Managed Care - HMO

## 2023-11-23 DIAGNOSIS — G4733 Obstructive sleep apnea (adult) (pediatric): Secondary | ICD-10-CM | POA: Diagnosis present

## 2023-11-23 DIAGNOSIS — I2729 Other secondary pulmonary hypertension: Secondary | ICD-10-CM | POA: Diagnosis present

## 2023-11-23 DIAGNOSIS — J45909 Unspecified asthma, uncomplicated: Secondary | ICD-10-CM | POA: Diagnosis present

## 2023-11-23 DIAGNOSIS — J479 Bronchiectasis, uncomplicated: Secondary | ICD-10-CM

## 2023-11-23 DIAGNOSIS — I2510 Atherosclerotic heart disease of native coronary artery without angina pectoris: Secondary | ICD-10-CM | POA: Diagnosis present

## 2023-11-23 DIAGNOSIS — I358 Other nonrheumatic aortic valve disorders: Secondary | ICD-10-CM | POA: Diagnosis present

## 2023-11-23 DIAGNOSIS — R051 Acute cough: Secondary | ICD-10-CM | POA: Diagnosis not present

## 2023-11-23 DIAGNOSIS — J471 Bronchiectasis with (acute) exacerbation: Secondary | ICD-10-CM

## 2023-11-23 DIAGNOSIS — I872 Venous insufficiency (chronic) (peripheral): Secondary | ICD-10-CM | POA: Diagnosis present

## 2023-11-23 DIAGNOSIS — E785 Hyperlipidemia, unspecified: Secondary | ICD-10-CM | POA: Diagnosis present

## 2023-11-23 DIAGNOSIS — R0989 Other specified symptoms and signs involving the circulatory and respiratory systems: Secondary | ICD-10-CM | POA: Diagnosis present

## 2023-11-23 DIAGNOSIS — I10 Essential (primary) hypertension: Secondary | ICD-10-CM | POA: Diagnosis present

## 2023-11-23 DIAGNOSIS — J449 Chronic obstructive pulmonary disease, unspecified: Secondary | ICD-10-CM | POA: Diagnosis present

## 2023-11-23 HISTORY — DX: Sleep apnea, unspecified: G47.30

## 2023-11-23 HISTORY — DX: Essential (primary) hypertension: I10

## 2023-11-23 HISTORY — DX: Other specified symptoms and signs involving the circulatory and respiratory systems: R09.89

## 2023-11-23 HISTORY — DX: Chronic obstructive pulmonary disease, unspecified: J44.9

## 2023-11-23 LAB — POC COVID19/FLU A&B COMBO
Covid Antigen, POC: NEGATIVE
Influenza A Antigen, POC: NEGATIVE
Influenza B Antigen, POC: NEGATIVE

## 2023-11-23 MED ORDER — ALBUTEROL SULFATE (2.5 MG/3ML) 0.083% IN NEBU
INHALATION_SOLUTION | RESPIRATORY_TRACT | Status: AC
  Filled 2023-11-23: qty 3

## 2023-11-23 MED ORDER — LEVOFLOXACIN 500 MG PO TABS: 500.0000 mg | ORAL_TABLET | Freq: Every day | ORAL | 0 refills | Status: AC

## 2023-11-23 MED ORDER — PREDNISONE 20 MG PO TABS: 40.0000 mg | ORAL_TABLET | Freq: Every day | ORAL | 0 refills | Status: AC

## 2023-11-23 MED ORDER — ALBUTEROL SULFATE (2.5 MG/3ML) 0.083% IN NEBU
2.5000 mg | INHALATION_SOLUTION | Freq: Once | RESPIRATORY_TRACT | Status: AC
Start: 2023-11-23 — End: 2023-11-23
  Administered 2023-11-23: 2.5 mg via RESPIRATORY_TRACT

## 2023-11-23 NOTE — Discharge Instructions (Signed)
 The flu and COVID test is negative  The chest x-ray looks unchanged to me.  The radiologist will also read your x-ray, and if their interpretation differs significantly from mine, we will call you.  Levaquin  500 mg--take 1 tablet daily for 7 days  Take prednisone  20 mg--2 daily for 5 days   If you cannot get the oxygen saturation to improve to over 90 using oxygen at home, then please go to the emergency room.

## 2023-11-23 NOTE — ED Provider Notes (Addendum)
 [facility]    CSN: [id number] Arrival date & time: 11/23/23  9149      History   Chief Complaint Chief Complaint  Patient presents with   Cough    HPI Latoya Foster is a 72 y.o. female.    Cough Here for worsening cough and shortness of breath.  She had been begun on azithromycin about 5 days ago.  Her daughter is a pediatrician and heard some rales in her lower lung fields.  She had started her on the azithromycin  Then [facility] and they felt that she had a right lower lobe infiltrate on x-ray.  I started her on cefdinir on December 28.  She had improved some but then yesterday her cough worsened and her O2 sat at home would go into the upper 80s.  A good bit of the time and was 86 to 90% it would definitely be on the lower end of that if she was coughing or walking.  She does usually use oxygen at night with CPAP but does not usually require it during the day.  Her daughter did put the oxygen on her last evening and it did increase her O2 sat to normal.  She does have a history of bronchiectasis and COPD/asthma.  Her daughter states that the final report on the chest x-ray done on December 28 actually was read as negative for infiltrate.  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      Home Medications    Prior to Admission medications   Medication Sig Start Date End Date Taking? Authorizing Provider  levofloxacin  (LEVAQUIN ) 500 MG tablet Take 1 tablet (500 mg total) by mouth daily for 7 days. 11/23/23 11/30/23 Yes Adrianna Dudas, Sharlet POUR, MD  predniSONE  (DELTASONE ) 20 MG tablet Take 2 tablets (40 mg total) by mouth daily with breakfast for 5 days. 11/23/23 11/28/23 Yes Bernell Sigal K, MD  alendronate (FOSAMAX) 70 MG tablet Take 70 mg by mouth once a week. 12/29/21   [provider]  aspirin  EC 81 MG tablet Take 1 tablet (81 mg total) by mouth daily. Swallow whole. You may start out taking this every other day,  as tolerated. 09/10/23   Patwardhan, Newman PARAS, MD  atorvastatin  (LIPITOR) 40 MG tablet Take 40 mg by mouth daily. 03/26/22   [provider]  fluticasone -salmeterol (ADVAIR) 250-50 MCG/ACT AEPB INHALE 1 PUFF INTO THE LUNGS IN THE MORNING AND AT BEDTIME 11/25/22   Jude Harden GAILS, MD  montelukast (SINGULAIR) 10 MG tablet Take 10 mg by mouth at bedtime.    [provider]  telmisartan  (MICARDIS ) 40 MG tablet Take 1 tablet (40 mg total) by mouth daily. 01/29/23  Patwardhan, Newman PARAS, MD  TIOTROPIUM BROMIDE  MONOHYDRATE IN Inhale into the lungs.    [provider]  umeclidinium bromide  (INCRUSE ELLIPTA ) 62.5 MCG/ACT AEPB Inhale 1 puff into the lungs daily. 05/21/22   Jude Harden GAILS, MD    Family History Family History  Problem Relation Age of Onset   Heart attack Father 64       first    Social History Social History   Tobacco Use   Smoking status: Never   Smokeless tobacco: Never  Vaping Use   Vaping status: Never Used  Substance Use Topics   Alcohol use: Never   Drug use: Never     Allergies   Penicillamine   Review of Systems Review of Systems  Respiratory:  Positive for cough.      Physical Exam Triage Vital  Signs ED Triage Vitals [11/23/23 0859]  Encounter Vitals Group     BP (!) 158/80     Systolic BP Percentile      Diastolic BP Percentile      Pulse Rate 100     Resp (!) 28     Temp 98.8 F (37.1 C)     Temp Source Oral     SpO2 94 %     Weight      Height      Head Circumference      Peak Flow      Pain Score      Pain Loc      Pain Education      Exclude from Growth Chart    No data found.  Updated Vital Signs BP (!) 158/80 (BP Location: Right Arm)   Pulse 100   Temp 98.8 F (37.1 C) (Oral)   Resp (!) 28   SpO2 93%   Visual Acuity Right Eye Distance:   Left Eye Distance:   Bilateral Distance:    Right Eye Near:   Left Eye Near:    Bilateral Near:     Physical Exam Vitals reviewed.  Constitutional:      General: She is not in acute distress.    Appearance: She is not ill-appearing, toxic-appearing or diaphoretic.     Comments: Initially at triage her O2 sat was 88% after walking to the room.  It came up to 94% while giving history and she was sitting, on room air  HENT:     Mouth/Throat:     Mouth: Mucous membranes are moist.  Eyes:     Extraocular Movements: Extraocular movements intact.     Conjunctiva/sclera: Conjunctivae normal.     Pupils: Pupils are equal, round, and reactive to light.  Cardiovascular:     Rate and Rhythm: Normal rate and regular rhythm.     Heart sounds: No murmur heard. Pulmonary:     Effort: No respiratory distress.     Breath sounds: No stridor.     Comments: There are low pitched wheezes or rhonchi heard on expiration. Musculoskeletal:     Cervical back: Neck supple.  Lymphadenopathy:     Cervical: No cervical adenopathy.  Skin:    Coloration: Skin is not jaundiced or pale.  Neurological:     General: No focal deficit present.     Mental Status: She is alert and oriented to person, place, and time.  Psychiatric:        Behavior: Behavior normal.      UC Treatments / Results  Labs (all labs ordered are listed,  but only abnormal results are displayed)  Labs Reviewed  POC COVID19/FLU A&B COMBO - Normal    EKG   Radiology No results found.  Procedures Procedures (including critical care time)  Medications Ordered in UC Medications  albuterol  (PROVENTIL ) (2.5 MG/3ML) 0.083% nebulizer solution 2.5 mg (2.5 mg Nebulization Given 11/23/23 0930)    Initial Impression / Assessment and Plan / UC Course  I have reviewed the triage vital signs and the nursing notes.  Pertinent labs & imaging results that were available during my care of the patient were reviewed by me and considered in my medical decision making (see chart for details).     After the nebulizer treatment she maybe feels a little bit better but her rhonchi/low pitched wheezes persist.  Also her O2 sat went down to 80% on room air.  We applied oxygen while she was waiting on other results.  O2 sat came up to 95% on 2 L of oxygen  Chest x-ray by my review is unchanged.  I can see the x-ray done on December 28 to compare.  COVID and flu test is negative.  Since she has bronchiectasis and this could be an exacerbation of that, Levaquin  is sent in to better treat a broad spectrum of bacteria and prednisone  is sent in since she is having some wheezing.  Since they have oxygen to use at home we will not have her go to the hospital today.  Discussed with her daughter and the patient that if they cannot get her saturation to improve with oxygen administration at home that they are to go to the emergency room Final Clinical Impressions(s) / UC Diagnoses   Final diagnoses:  Acute cough  Bronchiectasis with acute exacerbation [facility])     Discharge Instructions      The flu and COVID test is negative  The chest x-ray looks unchanged to me.  The radiologist will also read your x-ray, and if their interpretation differs significantly from mine, we will call you.  Levaquin  500 mg--take 1 tablet daily for 7 days  Take prednisone  20 mg--2  daily for 5 days   If you cannot get the oxygen saturation to improve to over 90 using oxygen at home, then please go to the emergency room.      ED Prescriptions     Medication Sig Dispense Auth. Provider   levofloxacin  (LEVAQUIN ) 500 MG tablet Take 1 tablet (500 mg total) by mouth daily for 7 days. 7 tablet Marcela Alatorre K, MD   predniSONE  (DELTASONE ) 20 MG tablet Take 2 tablets (40 mg total) by mouth daily with breakfast for 5 days. 10 tablet Vonna Lesette Frary K, MD      PDMP not reviewed this encounter.   Vonna Sharlet POUR, MD 11/23/23 1101    Vonna Sharlet POUR, MD 11/23/23 [phone]

## 2023-11-23 NOTE — ED Triage Notes (Signed)
 Saw [facility] Saturday, dx early ONA was started on antibiotics. Worsening cough since yesterday. Doesn't need O2 during day just with CPAP at night. Used 3L  Hx lung disease.

## 2023-11-29 ENCOUNTER — Ambulatory Visit (INDEPENDENT_AMBULATORY_CARE_PROVIDER_SITE_OTHER): Payer: Commercial Managed Care - HMO | Admitting: Pulmonary Disease

## 2023-11-29 ENCOUNTER — Encounter (HOSPITAL_BASED_OUTPATIENT_CLINIC_OR_DEPARTMENT_OTHER): Payer: Self-pay | Admitting: Pulmonary Disease

## 2023-11-29 VITALS — BP 128/68 | HR 96 | Resp 14 | Ht <= 58 in | Wt 120.1 lb

## 2023-11-29 DIAGNOSIS — J45909 Unspecified asthma, uncomplicated: Secondary | ICD-10-CM | POA: Diagnosis not present

## 2023-11-29 DIAGNOSIS — J9611 Chronic respiratory failure with hypoxia: Secondary | ICD-10-CM | POA: Diagnosis not present

## 2023-11-29 DIAGNOSIS — J479 Bronchiectasis, uncomplicated: Secondary | ICD-10-CM

## 2023-11-29 MED ORDER — ALBUTEROL SULFATE HFA 108 (90 BASE) MCG/ACT IN AERS: 2.0000 | INHALATION_SPRAY | Freq: Four times a day (QID) | RESPIRATORY_TRACT | 6 refills | Status: DC | PRN

## 2023-11-29 MED ORDER — PREDNISONE 10 MG PO TABS: ORAL_TABLET | ORAL | 0 refills | Status: DC

## 2023-11-29 NOTE — Patient Instructions (Addendum)
 Rx for albuterol MDI & albuterol nebs X Nebuliser to DME  X Course of prednisone x 2 weeks  X Hi res CT chest in 2 months

## 2023-11-29 NOTE — Progress Notes (Signed)
 Subjective:    Patient ID: Latoya Foster, female    DOB: Jul 27, 1951, 73 y.o.   MRN: [id number]  HPI  73 yo never smoker for FU of bronchiectasis, COPD/asthma chronic hypoxic respiratory failure , OSA on CPAP Her daughter Arthor is a pediatrician at [facility]   PMH -hypertension, hyperlipidemia, angina, on Plavix   In 2020 she was hospitalized in [place] and started on daytime oxygen  She has since weaned off daytime oxygen but uses it at night  Chief Complaint  Patient presents with   Follow-up    Feeling slightly better since being treated at urgent care. Coughing up white phlegm. SOB no fever she does have wheezing and congestion    Discussed the use of AI scribe software for clinical note transcription with the patient, who gave verbal consent to proceed.  History of Present Illness   The patient, with a history of bronchiectasis, chronic respiratory failure on nocturnal oxygen, and asthma, presents with a recent episode of coughing and wheezing. The symptoms started with a cold, which the patient believes was contracted from her grand-daughter. The patient was treated with three courses of antibiotics, including azithromycin, Levaquin , and cefdinir. The patient reports a 50% improvement in symptoms, but continues to experience coughing with clear sputum. The patient denies fever and body aches, but reports rib pain associated with coughing, which has improved with painkillers. The patient also reports improved sleep in the last two days. The patient has a history of sleep apnea and uses a CPAP machine, but has been using more oxygen due to the recent illness.       Significant tests/ events reviewed     05/2022 ONO on CPAP/room air showed significant desaturation for almost 6 hours.  PFTs 08/2021 >> severe airway obstruction, ratio 68, FEV1 34%, FVC 40%, no bronchodilator response, TLC 74%, DLCO 7.8/26%   09/2021 CPAP titration study showed pressure requirements up to 17 cm, no  O2 required   HST 06/2021 - moderate OSA AHI 24/hour Nadir lowest saturation was 62%   NPSG 2016-AHI 30/hour with desaturation 62% out of proportion to degree of OSA, corrected by CPAP 12 cm   CT cors 08/2022 showed atelectasis versus scarring in bilateral lower lobes right middle lobe and lingula, paraseptal and centrilobular emphysema, calcium  score was 212/67 percentile    CT angiogram chest 02/2018 bronchiectasis of right middle lobe and lingula, dilated pulmonary artery 31 mm   Echo 08/2022 nml LVEF, no evidence of pulm hypertensionEcho 2022 RVSP 38 Echo 02/2018 RVSP 67 with severe TR  Review of Systems neg for any significant sore throat, dysphagia, itching, sneezing, nasal congestion or excess/ purulent secretions, fever, chills, sweats, unintended wt loss, pleuritic or exertional cp, hempoptysis, orthopnea pnd or change in chronic leg swelling. Also denies presyncope, palpitations, heartburn, abdominal pain, nausea, vomiting, diarrhea or change in bowel or urinary habits, dysuria,hematuria, rash, arthralgias, visual complaints, headache, numbness weakness or ataxia.     Objective:   Physical Exam  Gen. Pleasant, well-nourished, in no distress ENT - no thrush, no pallor/icterus,no post nasal drip Neck: No JVD, no thyromegaly, no carotid bruits Lungs: no use of accessory muscles, no dullness to percussion, coarse bilateral rhonchi  Cardiovascular: Rhythm regular, heart sounds  normal, no murmurs or gallops, no peripheral edema Musculoskeletal: No deformities, no cyanosis or clubbing        Assessment & Plan:   Will treat as COPD exacerbation    Bronchiectasis Chronic condition with recent exacerbation likely triggered by a  viral infection. Reports 50% improvement in symptoms but still experiencing cough and wheezing. Recent chest x-ray showed no infiltrates. No fever or body aches. White, clear sputum. Pain from coughing has decreased. No further antibiotics needed as the  infection appears resolved. Inflammation in the airways may take longer to settle down; albuterol  and prednisone  will help speed up recovery. - Continue Advair and Incruse - Prescribe albuterol  inhaler - Prescribe longer course of prednisone  - Order chest CT in 2-3 months to reassess bronchiectasis - Follow-up in 2 weeks via MyChart  Asthma Exacerbation likely secondary to recent viral infection. No albuterol  inhaler currently available. Wheezing noted during examination. Discussed nebulizer use during future exacerbations and benefits of a longer course of prednisone  to reduce airway inflammation. - Prescribe albuterol  inhaler - Consider nebulizer for future exacerbations - Prescribe longer course of prednisone   Chronic Respiratory Failure on Nocturnal Oxygen Well-managed on 2 liters of oxygen at night. Daytime oxygen requirement has decreased to intermittent use. Performing lung exercises and using a spirometer but with limited ability. As respiratory symptoms improve, oxygen needs may decrease further. - Continue nocturnal oxygen therapy - Continue lung exercises and spirometer use  General Health Maintenance Received COVID and flu vaccines. No RSV vaccine yet. Discussed benefits of RSV vaccine, which is well tolerated and effective for at least two years. Recommended to get the vaccine once recovered. - Recommend RSV vaccine once recovered - Discuss RSV vaccine with primary care provider  Follow-up - Follow-up in 2 weeks via MyChart - Order chest CT in 2-3 months - Schedule follow-up appointment after chest CT.

## 2023-12-06 ENCOUNTER — Other Ambulatory Visit: Payer: Self-pay | Admitting: Pulmonary Disease

## 2023-12-07 ENCOUNTER — Telehealth: Payer: Self-pay | Admitting: Pulmonary Disease

## 2023-12-08 ENCOUNTER — Other Ambulatory Visit (HOSPITAL_BASED_OUTPATIENT_CLINIC_OR_DEPARTMENT_OTHER): Payer: Self-pay

## 2023-12-08 ENCOUNTER — Encounter (HOSPITAL_BASED_OUTPATIENT_CLINIC_OR_DEPARTMENT_OTHER): Payer: Self-pay

## 2023-12-08 MED ORDER — FLUTICASONE-SALMETEROL 250-50 MCG/ACT IN AEPB: 1.0000 | INHALATION_SPRAY | Freq: Two times a day (BID) | RESPIRATORY_TRACT | 5 refills | Status: DC

## 2023-12-08 MED ORDER — ALBUTEROL SULFATE HFA 108 (90 BASE) MCG/ACT IN AERS: 2.0000 | INHALATION_SPRAY | Freq: Four times a day (QID) | RESPIRATORY_TRACT | 6 refills | Status: AC | PRN

## 2023-12-10 ENCOUNTER — Ambulatory Visit: Payer: Commercial Managed Care - HMO | Attending: Internal Medicine | Admitting: Student

## 2023-12-10 ENCOUNTER — Telehealth: Payer: Self-pay | Admitting: Pharmacy Technician

## 2023-12-10 ENCOUNTER — Telehealth: Payer: Self-pay | Admitting: Pharmacist

## 2023-12-10 ENCOUNTER — Other Ambulatory Visit (HOSPITAL_COMMUNITY): Payer: Self-pay

## 2023-12-10 ENCOUNTER — Encounter: Payer: Self-pay | Admitting: Student

## 2023-12-10 ENCOUNTER — Encounter: Payer: Self-pay | Admitting: Cardiology

## 2023-12-10 DIAGNOSIS — I2510 Atherosclerotic heart disease of native coronary artery without angina pectoris: Secondary | ICD-10-CM | POA: Diagnosis not present

## 2023-12-10 DIAGNOSIS — E785 Hyperlipidemia, unspecified: Secondary | ICD-10-CM

## 2023-12-10 DIAGNOSIS — E782 Mixed hyperlipidemia: Secondary | ICD-10-CM

## 2023-12-10 NOTE — Telephone Encounter (Signed)
VP, I see she saw you today. Would you mind taking the question?  Thanks MJP

## 2023-12-10 NOTE — Patient Instructions (Signed)
Your Results:             Your most recent labs Goal  Total Cholesterol 177 < 200  Triglycerides 100 < 150  HDL (happy/good cholesterol) 68 > 40  LDL (lousy/bad cholesterol 91 < 70   Medication changes: We will start the process to get PCSK9i ( Repatha or Praluent)  covered by your insurance.  Once the prior authorization is complete, we will call you to let you know and confirm pharmacy information.     Praluent is a cholesterol medication that improved your body's ability to get rid of "bad cholesterol" known as LDL. It can lower your LDL up to 60%. It is an injection that is given under the skin every 2 weeks. The most common side effects of Praluent include runny nose, symptoms of the common cold, rarely flu or flu-like symptoms, back/muscle pain in about 3-4% of the patients, and redness, pain, or bruising at the injection site.    Repatha is a cholesterol medication that improved your body's ability to get rid of "bad cholesterol" known as LDL. It can lower your LDL up to 60%! It is an injection that is given under the skin every 2 weeks. The medication often requires a prior authorization from your insurance company.  The most common side effects of Repatha include runny nose, symptoms of the common cold, rarely flu or flu-like symptoms, back/muscle pain in about 3-4% of the patients, and redness, pain, or bruising at the injection site.   Lab orders: We want to repeat labs after 2-3 months.  We will send you a lab order to remind you once we get closer to that time.

## 2023-12-10 NOTE — Telephone Encounter (Signed)
Pharmacy Patient Advocate Encounter  Received notification from [employer] that Prior Authorization for repatha has been APPROVED from 12/10/23 to 12/09/24. Ran test claim, Copay is $24.99 one month. This test claim was processed through [facility]- copay amounts may vary at other pharmacies due to pharmacy/plan contracts, or as the patient moves through the different stages of their insurance plan.   PA #/Case ID/Reference #: [id number]

## 2023-12-10 NOTE — Progress Notes (Unsigned)
Patient ID: Latoya Foster                 DOB: May 04, 1951                    MRN: [id number]      HPI: Latoya Foster is a 73 y.o. female patient referred to lipid clinic by Dr.Patwardhan. PMH is significant for hypertension, coronary artery disease, asthma/bronchiectasis   Patient presented today for lipid clinic with her daughter who is pediatrician at [facility]. We discussed lipid lowering injectable therapies and ezetimibe in details. Discussed mechanisms of action, dosing, side effects and potential decreases in LDL cholesterol.  Also reviewed cost information and potential options for patient assistance.  Discussed role of lifestyle in cholesterol management. 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 Family hx includes father had MI at age 75. 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- possible familial hypercholesterolemia (FH) LDL goal: <70 mg/dl  Last lab : TC 355 mg/dl, HDL 68 mg/dl LDLc 91 mg/dl, TG 732 mg/dl   Diet: vegetarian - low fat, does not eat out much    Exercise: stretching  every day, SOB/COPD limits cardio   Family History:  Relation Problem Comments  Mother (Deceased at age 19)   Father (Deceased at age 80) Heart attack (Age: 23) first    Sister Metallurgist)   Sister Metallurgist)   Sister (Deceased)   Brother Metallurgist)   Brother (Deceased)      Social History:  Alcohol: none  Smoking: never  Labs: Lipid Panel  No results found for: "CHOL", "TRIG", "HDL", "CHOLHDL", "VLDL", "LDLCALC", "LDLDIRECT", "LABVLDL"  Past Medical History:  Diagnosis Date   Bruit    COPD (chronic obstructive pulmonary disease) (HCC)    Hypertension    Sleep apnea     Current Outpatient Medications on File Prior to Visit  Medication Sig Dispense Refill   albuterol (VENTOLIN HFA) 108 (90 Base) MCG/ACT inhaler Inhale 2 puffs into the  lungs every 6 (six) hours as needed for wheezing or shortness of breath. 8 g 6   alendronate (FOSAMAX) 70 MG tablet Take 70 mg by mouth once a week.     aspirin EC 81 MG tablet Take 1 tablet (81 mg total) by mouth daily. Swallow whole. You may start out taking this every other day,  as tolerated.     atorvastatin (LIPITOR) 40 MG tablet Take 40 mg by mouth daily.     fluticasone-salmeterol (ADVAIR) 250-50 MCG/ACT AEPB Inhale 1 puff into the lungs in the morning and at bedtime. 60 each 5   montelukast (SINGULAIR) 10 MG tablet Take 10 mg by mouth at bedtime.     predniSONE (DELTASONE) 10 MG tablet Take 4 tabs  daily with food x 4 days, then 3 tabs daily x 4 days, then 2 tabs daily x 4 days, then 1 tab daily x4 days then stop. #40 40 tablet 0   telmisartan (MICARDIS) 40 MG tablet Take 1 tablet (40 mg total) by mouth daily. 180 tablet 3   TIOTROPIUM BROMIDE MONOHYDRATE IN Inhale into the lungs.     umeclidinium bromide (INCRUSE ELLIPTA) 62.5 MCG/ACT AEPB Inhale 1 puff into the lungs daily. 30 each 5   No current facility-administered medications on file prior to visit.    Allergies  Allergen Reactions   Penicillamine Nausea And Vomiting  Assessment/Plan:  1. 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 [place], [place] [address]  Phone: [phone]; Fax: [phone]

## 2023-12-10 NOTE — Telephone Encounter (Signed)
Pharmacy Patient Advocate Encounter   Received notification from Pt Calls Messages that prior authorization for repatha is required/requested.   Insurance verification completed.   The patient is insured through [employer] .   Per test claim: PA required; PA submitted to above mentioned insurance via CoverMyMeds Key/confirmation #/EOC [facility] Status is pending

## 2023-12-10 NOTE — Assessment & Plan Note (Signed)
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

## 2023-12-14 MED ORDER — REPATHA SURECLICK 140 MG/ML ~~LOC~~ SOAJ: 140.0000 mg | SUBCUTANEOUS | 3 refills | Status: DC

## 2023-12-14 NOTE — Telephone Encounter (Signed)
Call daughter and informed about Repatha Approval and Co-pay. Will get f/u lipid and Lp(a) 1st week of April to assess Repatha's effectiveness.

## 2023-12-14 NOTE — Addendum Note (Signed)
Addended by: Tylene Fantasia on: 12/14/2023 08:31 AM   Modules accepted: Orders

## 2023-12-15 ENCOUNTER — Other Ambulatory Visit: Payer: Self-pay | Admitting: Pulmonary Disease

## 2023-12-15 LAB — LIPOPROTEIN A (LPA): Lipoprotein (a): 53 nmol/L (ref <75.0)

## 2023-12-15 NOTE — Progress Notes (Signed)
Noted ? ?Thanks ?MJP ? ?

## 2023-12-16 ENCOUNTER — Encounter: Payer: Self-pay | Admitting: Pharmacist

## 2023-12-16 MED ORDER — FLUTICASONE-SALMETEROL 250-50 MCG/ACT IN AEPB: 1.0000 | INHALATION_SPRAY | Freq: Two times a day (BID) | RESPIRATORY_TRACT | 5 refills | Status: AC

## 2023-12-16 MED ORDER — ALBUTEROL SULFATE (2.5 MG/3ML) 0.083% IN NEBU: 2.5000 mg | INHALATION_SOLUTION | Freq: Four times a day (QID) | RESPIRATORY_TRACT | 12 refills | Status: AC | PRN

## 2023-12-28 ENCOUNTER — Encounter (HOSPITAL_BASED_OUTPATIENT_CLINIC_OR_DEPARTMENT_OTHER): Payer: Self-pay | Admitting: Pulmonary Disease

## 2023-12-28 NOTE — Telephone Encounter (Signed)
PA approved see other encounter

## 2024-01-28 ENCOUNTER — Ambulatory Visit
Admission: RE | Admit: 2024-01-28 | Discharge: 2024-01-28 | Disposition: A | Discharge status: Home / Self Care | Payer: Commercial Managed Care - HMO | Source: Ambulatory Visit | Attending: Pulmonary Disease | Admitting: Pulmonary Disease

## 2024-01-28 DIAGNOSIS — J479 Bronchiectasis, uncomplicated: Secondary | ICD-10-CM

## 2024-02-05 ENCOUNTER — Encounter: Payer: Self-pay | Admitting: Cardiology

## 2024-02-09 MED ORDER — ATORVASTATIN CALCIUM 20 MG PO TABS: 20.0000 mg | ORAL_TABLET | Freq: Every day | ORAL | 3 refills | Status: DC

## 2024-02-09 NOTE — Progress Notes (Signed)
 Fantastic response to Repatha. LDL 9. Cut down Lipitor to 20?  Thanks MJP

## 2024-02-10 ENCOUNTER — Encounter: Payer: Self-pay | Admitting: Pharmacist

## 2024-02-10 LAB — LIPID PANEL
Chol/HDL Ratio: 1.3 ratio (ref 0.0–4.4)
Cholesterol, Total: 105 mg/dL (ref 100–199)
HDL: 82 mg/dL (ref >39)
LDL Chol Calc (NIH): 9 mg/dL (ref 0–99)
Triglycerides: 64 mg/dL (ref 0–149)
VLDL Cholesterol Cal: 14 mg/dL (ref 5–40)

## 2024-02-10 LAB — LIPOPROTEIN A (LPA): Lipoprotein (a): 63.5 nmol/L (ref <75.0)

## 2024-02-14 ENCOUNTER — Telehealth: Payer: Self-pay | Admitting: Pulmonary Disease

## 2024-02-14 ENCOUNTER — Encounter (HOSPITAL_BASED_OUTPATIENT_CLINIC_OR_DEPARTMENT_OTHER): Payer: Self-pay | Admitting: Pulmonary Disease

## 2024-02-14 NOTE — Telephone Encounter (Signed)
 Received report pt has appt tomorrow

## 2024-02-14 NOTE — Telephone Encounter (Signed)
 Tiffany calling with call report. For CT scan.Tiffany phone number is [phone].

## 2024-02-15 ENCOUNTER — Ambulatory Visit (HOSPITAL_BASED_OUTPATIENT_CLINIC_OR_DEPARTMENT_OTHER): Payer: Commercial Managed Care - HMO | Admitting: Primary Care

## 2024-02-15 ENCOUNTER — Encounter (HOSPITAL_BASED_OUTPATIENT_CLINIC_OR_DEPARTMENT_OTHER): Payer: Self-pay | Admitting: Primary Care

## 2024-02-15 VITALS — BP 112/70 | HR 83 | Ht <= 58 in | Wt 114.2 lb

## 2024-02-15 DIAGNOSIS — J479 Bronchiectasis, uncomplicated: Secondary | ICD-10-CM | POA: Diagnosis not present

## 2024-02-15 DIAGNOSIS — G4733 Obstructive sleep apnea (adult) (pediatric): Secondary | ICD-10-CM

## 2024-02-15 DIAGNOSIS — R911 Solitary pulmonary nodule: Secondary | ICD-10-CM | POA: Diagnosis not present

## 2024-02-15 NOTE — Patient Instructions (Addendum)
-  LEFT LOWER LOBE LUNG NODULE: A small 6 mm nodule was found in your left lower lung lobe. This could be due to an infection, inflammation, or scarring. We will monitor it with a repeat CT scan in 6 months to see if it grows.  -CHRONIC RESPIRATORY FAILURE: This condition means your lungs are not getting enough oxygen into your blood. You are currently using CPAP and oxygen therapy. We recommend considering in person versus home pulmonary rehabilitation options to help improve your lung function and will monitor your oxygen levels during exertion.  -OBSTRUCTIVE SLEEP APNEA: This is a condition where your breathing stops and starts during sleep. You are using a CPAP machine, but it is outdated. We will order a new CPAP machine through your insurance and enroll it in [employer] for better monitoring.   -MODERATE EMPHYSEMA: Emphysema is a condition where the air sacs in your lungs are damaged. Your recent CT scan shows moderate emphysema with no significant changes from previous imaging.  -BRONCHIECTASIS: This is a chronic condition where the airways in your lungs are damaged and scarred. Your condition is stable with no signs of infection or pneumonia, and your cough and pain have decreased.  -ASTHMA: Asthma is a condition where your airways narrow and swell, making it hard to breathe. You are managing it well with your Advair inhaler and Incruse, and you have not needed your rescue inhaler recently.  INSTRUCTIONS:  Please schedule a follow-up appointment after your repeat CT scan in 6 months. We will also discuss pulmonary rehabilitation options after you return from your travel.  Follow-up 6 months with Dr. Vassie Loll (after CT chest)

## 2024-02-15 NOTE — Progress Notes (Signed)
 @Patient  ID: Latoya Foster, female    DOB: 1951/05/21, 73 y.o.   MRN: [id number]  Chief Complaint  Patient presents with   Follow-up    Referring provider: Lorenda Ishihara  HPI: 73 year old female, never smoker for FU of bronchiectasis, "COPD/asthma" chronic hypoxic respiratory failure , OSA on CPAP Her daughter Deniece Ree is a pediatrician at [facility]   PMH -hypertension, hyperlipidemia, angina, on Plavix   In 2020 she was hospitalized in [place] and started on daytime oxygen  She has since weaned off daytime oxygen but uses it at night   Previous [facility] pulmonary encounter:  11/29/23- Dr. Vassie Loll  Chief Complaint  Patient presents with   Follow-up    Feeling slightly better since being treated at urgent care. Coughing up white phlegm. SOB no fever she does have wheezing and congestion    Discussed the use of AI scribe software for clinical note transcription with the patient, who gave verbal consent to proceed.  History of Present Illness   The patient, with a history of bronchiectasis, chronic respiratory failure on nocturnal oxygen, and asthma, presents with a recent episode of coughing and wheezing. The symptoms started with a cold, which the patient believes was contracted from her grand-daughter. The patient was treated with three courses of antibiotics, including azithromycin, Levaquin, and cefdinir. The patient reports a 50% improvement in symptoms, but continues to experience coughing with clear sputum. The patient denies fever and body aches, but reports rib pain associated with coughing, which has improved with painkillers. The patient also reports improved sleep in the last two days. The patient has a history of sleep apnea and uses a CPAP machine, but has been using more oxygen due to the recent illness.     02/15/2024- interim hx  Discussed the use of AI scribe software for clinical note transcription with the patient, who gave verbal consent to proceed.  History of  Present Illness   Latoya Foster is a 73 year old female with bronchiectasis, asthma, chronic respiratory failure, and sleep apnea who presents for a follow-up visit. She was referred by Dr. Vassie Loll for follow-up of her chronic respiratory conditions.  She was last seen approximately three months ago, in January, when her cough and pain had decreased. A CT scan on 01/28/24 showed moderate emphysema, mild bronchiectasis, and a new 6 mm nodule in the left lower lobe. There was no evidence of airspace disease or pneumonia, and no lung masses were detected.  She uses a CPAP machine with oxygen at night for her sleep apnea. The CPAP machine is approximately 75 years old and was self-purchased online after being prescribed by a pulmonologist in [place].  She had a home sleep study in August 2022, which showed moderate obstructive sleep apnea, followed by a titration study in November 2022.  The pressure setting is at 17 cm h20, and she has not changed this setting since her last sleep study.  For her asthma, she uses an Advair inhaler (fluticasone salmeterol) twice daily, one puff in the morning and one in the evening. She also has Incruse but has not needed to use her rescue inhaler since January. During December and January, she experienced more frequent cough and used her rescue inhaler more often.  Her oxygen levels during the day are generally stable at 93-94%, but she experiences significant drops in oxygen saturation to 86-88% with exertion, such as walking up stairs, which requires her to rest to recover. She performs incentive spirometry exercises at home to  aid her lung function. She receives her oxygen supplies and CPAP accessories from [employer], including the oxygen cylinder and CPAP tubing and mask.      Significant tests/ events reviewed     05/2022 ONO on CPAP/room air showed significant desaturation for almost 6 hours.  PFTs 08/2021 >> severe airway obstruction, ratio 68, FEV1 34%, FVC 40%, no  bronchodilator response, TLC 74%, DLCO 7.8/26%   09/2021 CPAP titration study showed pressure requirements up to 17 cm, no O2 required   HST 06/2021 - moderate OSA AHI 24/hour Nadir lowest saturation was 62%   NPSG 2016-AHI 30/hour with desaturation 62% out of proportion to degree of OSA, corrected by CPAP 12 cm   CT cors 08/2022 showed atelectasis versus scarring in bilateral lower lobes right middle lobe and lingula, paraseptal and centrilobular emphysema, calcium score was 212/67 percentile    CT angiogram chest 02/2018 bronchiectasis of right middle lobe and lingula, dilated pulmonary artery 31 mm   Echo 08/2022 nml LVEF, no evidence of pulm hypertensionEcho 2022 RVSP 38 Echo 02/2018 RVSP 67 with severe TR  CT chest 02/14/24 >> moderate centrilobular emphysema. Similar appearance of mild bronchiectasis. New left lower lobe 6mm nodule. No acute consolidative airspace disease or lung masses. Needs repeat CT chest without contrast in 6 months.     Allergies  Allergen Reactions   Penicillamine Nausea And Vomiting    Immunization History  Administered Date(s) Administered   Pfizer Covid-19 Vaccine Bivalent Booster 2yrs & up 02/18/2021, 08/05/2021    Past Medical History:  Diagnosis Date   Bruit    COPD (chronic obstructive pulmonary disease) (HCC)    Hypertension    Sleep apnea     Tobacco History: Social History   Tobacco Use  Smoking Status Never  Smokeless Tobacco Never   Counseling given: Not Answered   Outpatient Medications Prior to Visit  Medication Sig Dispense Refill   albuterol (PROVENTIL) (2.5 MG/3ML) 0.083% nebulizer solution Take 3 mLs (2.5 mg total) by nebulization every 6 (six) hours as needed for wheezing or shortness of breath. 75 mL 12   albuterol (VENTOLIN HFA) 108 (90 Base) MCG/ACT inhaler Inhale 2 puffs into the lungs every 6 (six) hours as needed for wheezing or shortness of breath. 8 g 6   alendronate (FOSAMAX) 70 MG tablet Take 70 mg by mouth  once a week.     aspirin EC 81 MG tablet Take 1 tablet (81 mg total) by mouth daily. Swallow whole. You may start out taking this every other day,  as tolerated.     atorvastatin (LIPITOR) 20 MG tablet Take 1 tablet (20 mg total) by mouth daily. 90 tablet 3   Evolocumab (REPATHA SURECLICK) 140 MG/ML SOAJ Inject 140 mg into the skin every 14 (fourteen) days. 6 mL 3   fluticasone-salmeterol (ADVAIR) 250-50 MCG/ACT AEPB Inhale 1 puff into the lungs in the morning and at bedtime. 60 each 5   montelukast (SINGULAIR) 10 MG tablet Take 10 mg by mouth at bedtime.     predniSONE (DELTASONE) 10 MG tablet Take 4 tabs  daily with food x 4 days, then 3 tabs daily x 4 days, then 2 tabs daily x 4 days, then 1 tab daily x4 days then stop. #40 40 tablet 0   telmisartan (MICARDIS) 40 MG tablet Take 1 tablet (40 mg total) by mouth daily. 180 tablet 3   TIOTROPIUM BROMIDE MONOHYDRATE IN Inhale into the lungs.     umeclidinium bromide (INCRUSE ELLIPTA) 62.5 MCG/ACT AEPB Inhale  1 puff into the lungs daily. 30 each 5   No facility-administered medications prior to visit.   Review of Systems  Review of Systems  Constitutional: Negative.   HENT: Negative.    Respiratory:  Negative for cough, chest tightness and wheezing.        DOE  Cardiovascular: Negative.    Physical Exam  There were no vitals taken for this visit. Physical Exam Constitutional:      Appearance: Normal appearance.  HENT:     Head: Normocephalic and atraumatic.  Cardiovascular:     Rate and Rhythm: Normal rate and regular rhythm.  Pulmonary:     Effort: Pulmonary effort is normal.     Breath sounds: Normal breath sounds.  Neurological:     General: No focal deficit present.     Mental Status: She is alert and oriented to person, place, and time. Mental status is at baseline.  Psychiatric:        Mood and Affect: Mood normal.        Behavior: Behavior normal.        Thought Content: Thought content normal.        Judgment: Judgment  normal.      Lab Results:  CBC    Component Value Date/Time   WBC 12.2 (A) 11/08/2013 2002   RBC 4.58 11/08/2013 2002   HGB 13.8 11/08/2013 2002   HCT 43.7 11/08/2013 2002   MCV 95.5 11/08/2013 2002   MCH 30.1 11/08/2013 2002   MCHC 31.6 (A) 11/08/2013 2002    BMET    Component Value Date/Time   NA 140 09/01/2022 1006   K 4.7 09/01/2022 1006   CL 99 09/01/2022 1006   CO2 27 09/01/2022 1006   GLUCOSE 194 (H) 09/01/2022 1006   GLUCOSE 144 (H) 11/08/2013 2002   BUN 13 09/01/2022 1006   CREATININE 0.54 (L) 09/01/2022 1006   CREATININE 0.44 (L) 11/08/2013 2002   CALCIUM 9.8 09/01/2022 1006    BNP    Component Value Date/Time   BNP 14.8 11/08/2013 2058    ProBNP No results found for: "PROBNP"  Imaging: CT Chest High Resolution Result Date: 02/14/2024 CLINICAL DATA:  Diffuse/interstitial lung disease, nonsmoker EXAM: CT CHEST WITHOUT CONTRAST TECHNIQUE: Multidetector CT imaging of the chest was performed following the standard protocol without intravenous contrast. High resolution imaging of the lungs, as well as inspiratory and expiratory imaging, was performed. RADIATION DOSE REDUCTION: This exam was performed according to the departmental dose-optimization program which includes automated exposure control, adjustment of the mA and/or kV according to patient size and/or use of iterative reconstruction technique. COMPARISON:  11/23/2023 chest radiograph.  09/08/2022 coronary CT. FINDINGS: Cardiovascular: Normal heart size. No significant pericardial effusion/thickening. Left anterior descending coronary atherosclerosis. Atherosclerotic nonaneurysmal thoracic aorta. Normal caliber pulmonary arteries. Mediastinum/Nodes: No significant thyroid nodules. Unremarkable esophagus. No pathologically enlarged axillary, mediastinal or hilar lymph nodes, noting limited sensitivity for the detection of hilar adenopathy on this noncontrast study. Lungs/Pleura: No pneumothorax. No pleural  effusion. Moderate centrilobular emphysema. Thick curvilinear parenchymal bands in the posterior right middle lobe and in the lingula with some volume loss, unchanged, compatible with postinfectious/postinflammatory scarring. Posterior left lower lobe 0.6 cm solid pulmonary nodule (series 8/image 70), new from 09/08/2022 CT. Apical right upper lobe 0.2 cm solid pulmonary nodule (series 8/image 33), not previously imaged. No acute consolidative airspace disease or lung masses. No significant lobular air trapping or evidence of tracheobronchomalacia on the expiration sequence. No significant regions of subpleural reticulation, ground-glass  opacity, architectural distortion or frank honeycombing. Mild cylindrical bronchiolectasis with associated patchy mucoid impaction in the dependent basilar left lower lobe (series 8/images 101-103). Upper abdomen: Cholelithiasis. Simple 1.6 cm posterior upper right renal cyst, for which no follow-up imaging is recommended. Musculoskeletal: No aggressive appearing focal osseous lesions. Moderate thoracic spondylosis. IMPRESSION: 1. Posterior left lower lobe 0.6 cm solid pulmonary nodule, new from 09/08/2022 CT, cannot exclude early primary bronchogenic malignancy. Follow-up noncontrast chest CT recommended in 3 months in this high risk patient. This nodule is too small for PET evaluation at this time. 2. No evidence of interstitial lung disease. 3. Mild cylindrical bronchiolectasis with associated patchy mucoid impaction in the dependent basilar left lower lobe. 4. One vessel coronary atherosclerosis. 5. Cholelithiasis. 6. Aortic Atherosclerosis (ICD10-I70.0) and Emphysema (ICD10-J43.9). These results will be called to the ordering clinician or representative by the Radiologist Assistant, and communication documented in the PACS or [employer]. Electronically Signed   By: Delbert Phenix M.D.   On: 02/14/2024 12:49     Assessment & Plan:   1. Bronchiectasis without  complication (HCC) (Primary) - CT CHEST WO CONTRAST; Future  2. Pulmonary nodule - CT CHEST WO CONTRAST; Future  3. OSA on CPAP - Ambulatory Referral for DME     Left Lower Lobe Lung Nodule Hx bronchiectasis, non-smoker. CT chest 02/14/24 new left lower lobe 6mm nodule. Short-term follow-up required.  - Order repeat CT scan in 6 months to monitor nodule size.  Chronic Respiratory Failure Managed with CPAP and oxygen therapy at night. Patient notes oxygen desaturation with exertion especially with inclines but recovers with rest. Severe obstructive lung disease present on pulmonary testing, recommend patient consider pulmonary rehab. She did not desaturate on simple walk test in office today - Evaluate for home pulmonary rehab options through [facility] or in-person rehab at a facility.   Obstructive Sleep Apnea Moderate obstructive sleep apnea managed with CPAP and oxygen therapy. Current CPAP machine outdated, not connected to [employer]. Insurance covers replacement every five years. - Order new CPAP machine through insurance and establish with a local supply store. - Enroll CPAP machine in [employer] for data monitoring. - Consider bringing current CPAP machine to medical supply store for potential enrollment in [employer].  Asthma Stable; No recent rescue inhaler use. Cough resolved since January. - Continue Advair inhaler twice daily and Incruse as prescribed.  Moderate Emphysema Moderate emphysema noted on recent CT scan with no significant changes from previous imaging.  Bronchiectasis Chronic condition with lung scarring. Mild bronchiectasis stable on CT. No airspace disease or pneumonia. No active cough or chest pain.  Follow-up Follow-up plans discussed for monitoring and management of conditions. Pulmonary rehab options to be discussed after return from travel. - Discuss pulmonary rehab options after return from travel. - Schedule follow-up appointment after repeat CT scan in 6  months.      Glenford Bayley, NP 02/15/2024

## 2024-02-15 NOTE — Progress Notes (Signed)
Pt notified at OV

## 2024-08-16 ENCOUNTER — Other Ambulatory Visit (HOSPITAL_BASED_OUTPATIENT_CLINIC_OR_DEPARTMENT_OTHER)

## 2024-08-16 ENCOUNTER — Other Ambulatory Visit

## 2024-09-12 ENCOUNTER — Ambulatory Visit

## 2024-09-12 ENCOUNTER — Ambulatory Visit
Admission: RE | Admit: 2024-09-12 | Discharge: 2024-09-12 | Disposition: A | Source: Ambulatory Visit | Attending: Internal Medicine | Admitting: Internal Medicine

## 2024-09-12 DIAGNOSIS — N6320 Unspecified lump in the left breast, unspecified quadrant: Secondary | ICD-10-CM

## 2024-09-26 ENCOUNTER — Ambulatory Visit
Admission: RE | Admit: 2024-09-26 | Discharge: 2024-09-26 | Disposition: A | Discharge status: Home / Self Care | Source: Ambulatory Visit | Attending: Primary Care | Admitting: Primary Care

## 2024-09-26 DIAGNOSIS — J479 Bronchiectasis, uncomplicated: Secondary | ICD-10-CM

## 2024-09-26 DIAGNOSIS — R911 Solitary pulmonary nodule: Secondary | ICD-10-CM

## 2024-10-02 ENCOUNTER — Ambulatory Visit: Payer: Self-pay | Admitting: Primary Care

## 2024-10-06 ENCOUNTER — Encounter (HOSPITAL_BASED_OUTPATIENT_CLINIC_OR_DEPARTMENT_OTHER): Payer: Self-pay | Admitting: Pulmonary Disease

## 2024-10-06 ENCOUNTER — Other Ambulatory Visit (HOSPITAL_BASED_OUTPATIENT_CLINIC_OR_DEPARTMENT_OTHER): Payer: Self-pay

## 2024-10-06 ENCOUNTER — Ambulatory Visit: Admitting: Cardiology

## 2024-10-06 ENCOUNTER — Ambulatory Visit (HOSPITAL_BASED_OUTPATIENT_CLINIC_OR_DEPARTMENT_OTHER): Admitting: Pulmonary Disease

## 2024-10-06 VITALS — BP 132/64 | HR 87 | Ht <= 58 in | Wt 111.6 lb

## 2024-10-06 DIAGNOSIS — J9611 Chronic respiratory failure with hypoxia: Secondary | ICD-10-CM | POA: Diagnosis not present

## 2024-10-06 DIAGNOSIS — G4733 Obstructive sleep apnea (adult) (pediatric): Secondary | ICD-10-CM | POA: Diagnosis not present

## 2024-10-06 DIAGNOSIS — J479 Bronchiectasis, uncomplicated: Secondary | ICD-10-CM | POA: Diagnosis not present

## 2024-10-06 DIAGNOSIS — J4489 Other specified chronic obstructive pulmonary disease: Secondary | ICD-10-CM | POA: Diagnosis not present

## 2024-10-06 MED ORDER — COMIRNATY 30 MCG/0.3ML IM SUSY
0.3000 mL | PREFILLED_SYRINGE | Freq: Once | INTRAMUSCULAR | 0 refills | Status: AC
  Filled 2024-10-06: qty 0.3, 1d supply, fill #0

## 2024-10-06 NOTE — Patient Instructions (Signed)
  VISIT SUMMARY: You came in today for a follow-up visit regarding your chronic obstructive pulmonary disease (COPD), bronchiectasis, and obstructive sleep apnea (OSA). We discussed your current symptoms, reviewed your treatment plan, and made some recommendations to help manage your conditions.  YOUR PLAN: -CHRONIC OBSTRUCTIVE PULMONARY DISEASE (COPD) WITH CHRONIC RESPIRATORY FAILURE AND HYPOXEMIA: COPD is a chronic lung disease that makes it hard to breathe and can lead to low oxygen levels in your blood. Your oxygen levels drop to 87% with exertion, & recover quickly, you may benefit from daytime O2. If you notice symptoms of infection, start antibiotics and steroids early. Continue taking your current medications, Advair and Teotropium. We may consider portable oxygen if needed.  -BRONCHIECTASIS: Bronchiectasis is a condition where the airways in your lungs become widened and scarred, leading to mucus buildup and infections. Your condition is mild and stable. Continue using airway clearance techniques, like the flipper device, to prevent mucus buildup and infection. We will continue to monitor this with annual CT scans.  -OBSTRUCTIVE SLEEP APNEA (OSA): OSA is a condition where your breathing stops and starts during sleep. It is managed with CPAP therapy. Continue using your CPAP machine with the current settings and ensure you use oxygen with it at night. We will contact your CPAP service provider to address any insurance issues.  INSTRUCTIONS: Please continue using your nighttime oxygen and CPAP machine as discussed. Monitor for any signs of infection and start treatment early if needed. We will follow up with annual CT scans to monitor your bronchiectasis. If you have any issues with your CPAP service provider, we will assist in resolving them.                      Contains text generated by [employer].                                 Contains text  generated by [employer].

## 2024-10-06 NOTE — Progress Notes (Signed)
 Subjective:    Patient ID: Latoya Foster, female    DOB: 1951/05/27, 73 y.o.   MRN: [id number]   73 yo never smoker for FU of bronchiectasis, COPD/asthma chronic hypoxic respiratory failure , OSA on CPAP Her daughter Arthor is a pediatrician at [facility]   PMH -hypertension, hyperlipidemia, angina, on Plavix   In 2020 she was hospitalized in [place] and started on daytime oxygen  She has since weaned off daytime oxygen but uses it at night   Discussed the use of AI scribe software for clinical note transcription with the patient, who gave verbal consent to proceed.  History of Present Illness Latoya Foster is a 73 year old female with bronchiectasis and COPD who presents for follow-up.  She experiences shortness of breath with walking, with oxygen saturation dropping to 89-90% after short distances. Daytime oxygen levels are around 96%. She uses a CPAP machine at night with oxygen, set between 4 to 17 cm H2O, currently around 10 cm H2O, with an attached humidifier. Cough and phlegm production are infrequent. She had one significant episode of prolonged inflammation and infection in the past three years, treated with antibiotics and steroids. Her COPD is managed with Teotropium (Teova) and Advair, and she remains stable on these medications. She previously experienced more frequent respiratory infections in [place], possibly due to pollution.    Vitals:   10/06/24 0848  BP: 132/64  Pulse: 87  Height: 4' 9 (1.448 m)  Weight: 111 lb 9.6 oz (50.6 kg)  SpO2: 94%  BMI (Calculated): 24.14     Significant tests/ events reviewed     05/2022 ONO on CPAP/room air showed significant desaturation for almost 6 hours.  PFTs 08/2021 >> severe airway obstruction, ratio 68, FEV1 34%, FVC 40%, no bronchodilator response, TLC 74%, DLCO 7.8/26%   09/2021 CPAP titration study showed pressure requirements up to 17 cm, no O2 required   HST 06/2021 - moderate OSA AHI 24/hour Nadir lowest saturation was  62%   NPSG 2016-AHI 30/hour with desaturation 62% out of proportion to degree of OSA, corrected by CPAP 12 cm   CT chest 09/2024 Resolved left lower lobe pulmonary nodule  HRCT chest 01/2024 mild cylindrical bronchiectasis   CT cors 08/2022 showed atelectasis versus scarring in bilateral lower lobes right middle lobe and lingula, paraseptal and centrilobular emphysema, calcium  score was 212/67 percentile    CT angiogram chest 02/2018 bronchiectasis of right middle lobe and lingula, dilated pulmonary artery 31 mm   Echo 08/2022 nml LVEF, no evidence of pulm hypertensionEcho 2022 RVSP 38 Echo 02/2018 RVSP 67 with severe TR   Review of Systems  neg for any significant sore throat, dysphagia, itching, sneezing, nasal congestion or excess/ purulent secretions, fever, chills, sweats, unintended wt loss, pleuritic or exertional cp, hempoptysis, orthopnea pnd or change in chronic leg swelling. Also denies presyncope, palpitations, heartburn, abdominal pain, nausea, vomiting, diarrhea or change in bowel or urinary habits, dysuria,hematuria, rash, arthralgias, visual complaints, headache, numbness weakness or ataxia.      Objective:   Physical Exam  Gen. Pleasant, well-nourished,elderly, in no distress ENT - no thrush, no pallor/icterus,no post nasal drip Neck: No JVD, no thyromegaly, no carotid bruits Lungs: no use of accessory muscles, no dullness to percussion, BB dry rales Cardiovascular: Rhythm regular, heart sounds  normal, no murmurs or gallops, no peripheral edema Musculoskeletal: No deformities, no cyanosis or clubbing   Reviewed CT     Assessment & Plan:   Assessment and Plan Assessment & Plan Chronic  obstructive pulmonary disease (COPD) with chronic respiratory failure and hypoxemia COPD with chronic respiratory failure and hypoxemia, likely exacerbated by environmental factors such as pollution. Oxygen saturation drops to 89-90% with exertion, indicating potential need for  supplemental oxygen during activity. No current use of supplemental oxygen during the day, but nighttime use is recommended. CT scan shows emphysema-like pattern without definitive emphysema, likely due to past environmental exposure. Compromised lung function increases risk of infection and hypoxemia. - Walked with her to assess oxygen saturation during exertion. - Continue nighttime oxygen use. - Monitor for symptoms of infection and initiate antibiotics and steroids early if needed. - Continue current medications: Advair and Teotropium. - Consider portable oxygen if criteria are met>> she desat to 87% on walking , recovers within 1 minute  Bronchiectasis Mild bronchiectasis with no significant worsening over the past few years. CT scan shows mild bronchitis in the lower lung, but not the predominant pathology. No frequent infections or colonization by bad organisms. Airway clearance and early treatment of infections are key management strategies. - Continue annual CT scans to monitor bronchiectasis. - Use airway clearance techniques, including flipper device, to prevent mucus buildup and infection.  Obstructive sleep apnea (OSA) OSA managed with CPAP therapy. Current CPAP settings are 4-17 cm H2O. Oxygen is being used with CPAP at night. Insurance issues with CPAP service across states noted, but current setup is adequate. - Continue CPAP therapy with current settings. - Ensure oxygen is used with CPAP at night. - Will contact CPAP service provider to address insurance issues.

## 2024-10-24 ENCOUNTER — Encounter: Payer: Self-pay | Admitting: Cardiology

## 2024-10-24 ENCOUNTER — Ambulatory Visit: Attending: Cardiology | Admitting: Cardiology

## 2024-10-24 VITALS — BP 114/62 | HR 84 | Ht <= 58 in | Wt 111.0 lb

## 2024-10-24 DIAGNOSIS — I10 Essential (primary) hypertension: Secondary | ICD-10-CM | POA: Diagnosis not present

## 2024-10-24 DIAGNOSIS — Z79899 Other long term (current) drug therapy: Secondary | ICD-10-CM

## 2024-10-24 DIAGNOSIS — E782 Mixed hyperlipidemia: Secondary | ICD-10-CM | POA: Diagnosis not present

## 2024-10-24 DIAGNOSIS — I2510 Atherosclerotic heart disease of native coronary artery without angina pectoris: Secondary | ICD-10-CM | POA: Diagnosis not present

## 2024-10-24 MED ORDER — ASPIRIN 81 MG PO TBEC: 81.0000 mg | DELAYED_RELEASE_TABLET | ORAL | Status: AC

## 2024-10-24 MED ORDER — REPATHA SURECLICK 140 MG/ML ~~LOC~~ SOAJ: 140.0000 mg | SUBCUTANEOUS | 3 refills | Status: AC

## 2024-10-24 NOTE — Progress Notes (Signed)
 Cardiology Office Note:  .   Date:  10/24/2024  ID:  Latoya Foster, DOB 1951/07/19, MRN [id number] PCP: Elliot Charm, MD  [facility] Providers Cardiologist:  Newman Lawrence, MD PCP: Elliot Charm, MD  Chief Complaint  Patient presents with   Coronary Artery Disease      History of Present Illness: .    Latoya Foster is a 73 y.o. female with hypertension, hyperlipidemia, type 2 diabetes mellitus, coronary artery disease, COPD/bronchiectasis, OSA   Patient is here today with her daughter Dr. Arthor Foster.  She is doing well from cardiac standpoint and denies any chest pain symptoms.  She does have exertional dyspnea and is being treated for COPD, bronchiectasis and OSA by Dr. Jude.  Reviewed recent lab results with patient, did display.     Vitals:   10/24/24 1015  BP: 114/62  Pulse: 84  SpO2: 94%      ROS:  Review of Systems  Cardiovascular:  Positive for dyspnea on exertion. Negative for chest pain, leg swelling, palpitations and syncope.     Studies Reviewed: SABRA    EKG 10/24/2024: Normal sinus rhythm Normal ECG No previous ECGs available    Labs 09/2024: Chol 106, TG 93, HDL 74, LDL 14 HbA1C 7.1% Hb 14.4 Cr 0.5  Labs 01/2024: Chol 105, TG 64, HDL 82, LDL 9  06/2022: Chol 160, TG 84, HDL 64, LDL 81    Physical Exam:   Physical Exam Vitals and nursing note reviewed.  Constitutional:      General: She is not in acute distress. Neck:     Vascular: No JVD.  Cardiovascular:     Rate and Rhythm: Normal rate and regular rhythm.     Heart sounds: Normal heart sounds. No murmur heard. Pulmonary:     Effort: Pulmonary effort is normal.     Breath sounds: Normal breath sounds. No wheezing or rales.  Musculoskeletal:     Right lower leg: No edema.     Left lower leg: No edema.  Skin:    Comments: Stasis dermatitis      VISIT DIAGNOSES:   ICD-10-CM   1. Mixed hyperlipidemia  E78.2 EKG 12-Lead    Evolocumab  (REPATHA   SURECLICK) 140 MG/ML SOAJ    aspirin  EC 81 MG tablet    Lipid panel    Lipid panel    2. Coronary artery disease involving native coronary artery of native heart without angina pectoris  I25.10 EKG 12-Lead    aspirin  EC 81 MG tablet    3. Primary hypertension  I10 EKG 12-Lead    4. Medication management  Z79.899 aspirin  EC 81 MG tablet       ASSESSMENT AND PLAN: .    Latoya Foster is a 73 y.o. female with hypertension, hyperlipidemia, type 2 diabetes mellitus, coronary artery disease, COPD/bronchiectasis, OSA   CAD: Calcium  score 212, including calcium  and left main. Minimal luminal irregularities.  Currently not on aspirin .  Encouraged to take aspirin  81 mg at least every other day.  Look for side effect such as epistaxis or bleeding from varicose veins. Lipids very well-controlled on Repatha  and Lipitor 20 mg daily.  LDL down to 14.  Okay to stop Lipitor.  Aortic sclerosis: Minimal.   Hypertension: Controlled. Continue Telmisartan  40 mg daily.  Okay to stop diltiazem which she takes 90 mg twice daily.   Varicose veins/venous insufficieny: Stasis dermatitis without any other symptoms.  Continue compression stockings.   Meds ordered this encounter  Medications   Evolocumab  (REPATHA   SURECLICK) 140 MG/ML SOAJ    Sig: Inject 140 mg into the skin every 14 (fourteen) days.    Dispense:  6 mL    Refill:  3   aspirin  EC 81 MG tablet    Sig: Take 1 tablet (81 mg total) by mouth every other day. Swallow whole. You may start out taking this every other day,  as tolerated.     F/u in 1 year  Signed, Newman JINNY Lawrence, MD

## 2024-10-24 NOTE — Patient Instructions (Addendum)
 Medication Instructions:  STOP Atorvastatin  STOP Diltiazem   START Aspirin  81 mg every other day   *If you need a refill on your cardiac medications before your next appointment, please call your pharmacy*  Lipid panel in 6 months   Follow-Up: At [facility], you and your health needs are our priority.  As part of our continuing mission to provide you with exceptional heart care, our providers are all part of one team.  This team includes your primary Cardiologist (physician) and Advanced Practice Providers or APPs (Physician Assistants and Nurse Practitioners) who all work together to provide you with the care you need, when you need it.  Your next appointment:   1 year(s)  Provider:   Newman JINNY Lawrence, MD
